# Patient Record
Sex: Female | Born: 1960 | Race: White | Hispanic: No | Marital: Married | Smoking: Never smoker
Health system: Southern US, Community
[De-identification: ages and names within clinical notes are randomized; demographics above are authoritative.]

## PROBLEM LIST (undated history)

## (undated) DIAGNOSIS — I493 Ventricular premature depolarization: Secondary | ICD-10-CM

## (undated) DIAGNOSIS — E079 Disorder of thyroid, unspecified: Secondary | ICD-10-CM

## (undated) DIAGNOSIS — K589 Irritable bowel syndrome without diarrhea: Secondary | ICD-10-CM

## (undated) DIAGNOSIS — G8929 Other chronic pain: Secondary | ICD-10-CM

## (undated) DIAGNOSIS — B009 Herpesviral infection, unspecified: Secondary | ICD-10-CM

## (undated) DIAGNOSIS — N763 Subacute and chronic vulvitis: Secondary | ICD-10-CM

## (undated) DIAGNOSIS — M549 Dorsalgia, unspecified: Secondary | ICD-10-CM

## (undated) DIAGNOSIS — B379 Candidiasis, unspecified: Secondary | ICD-10-CM

## (undated) DIAGNOSIS — R87619 Unspecified abnormal cytological findings in specimens from cervix uteri: Secondary | ICD-10-CM

## (undated) HISTORY — DX: Ventricular premature depolarization: I49.3

## (undated) HISTORY — DX: Herpesviral infection, unspecified: B00.9

## (undated) HISTORY — DX: Irritable bowel syndrome, unspecified: K58.9

## (undated) HISTORY — PX: OTHER SURGICAL HISTORY: SHX169

## (undated) HISTORY — DX: Disorder of thyroid, unspecified: E07.9

## (undated) HISTORY — DX: Subacute and chronic vulvitis: N76.3

## (undated) HISTORY — DX: Other chronic pain: G89.29

## (undated) HISTORY — DX: Candidiasis, unspecified: B37.9

## (undated) HISTORY — DX: Hypocalcemia: E83.51

## (undated) HISTORY — DX: Dorsalgia, unspecified: M54.9

## (undated) HISTORY — PX: COLPOSCOPY: SHX161

## (undated) HISTORY — DX: Unspecified abnormal cytological findings in specimens from cervix uteri: R87.619

---

## 1986-01-24 HISTORY — PX: DIAGNOSTIC LAPAROSCOPY: SUR761

## 1987-01-25 HISTORY — PX: OTHER SURGICAL HISTORY: SHX169

## 1995-10-25 HISTORY — PX: THYROIDECTOMY: SHX17

## 1997-06-03 ENCOUNTER — Ambulatory Visit (HOSPITAL_COMMUNITY): Admission: RE | Admit: 1997-06-03 | Discharge: 1997-06-03 | Payer: Self-pay

## 1999-06-16 ENCOUNTER — Other Ambulatory Visit: Admission: RE | Admit: 1999-06-16 | Discharge: 1999-06-16 | Payer: Self-pay | Admitting: *Deleted

## 2000-06-14 ENCOUNTER — Other Ambulatory Visit: Admission: RE | Admit: 2000-06-14 | Discharge: 2000-06-14 | Payer: Self-pay | Admitting: *Deleted

## 2001-05-30 ENCOUNTER — Other Ambulatory Visit: Admission: RE | Admit: 2001-05-30 | Discharge: 2001-05-30 | Payer: Self-pay | Admitting: *Deleted

## 2002-01-24 DIAGNOSIS — I493 Ventricular premature depolarization: Secondary | ICD-10-CM

## 2002-01-24 HISTORY — DX: Ventricular premature depolarization: I49.3

## 2002-06-14 ENCOUNTER — Other Ambulatory Visit: Admission: RE | Admit: 2002-06-14 | Discharge: 2002-06-14 | Payer: Self-pay | Admitting: *Deleted

## 2003-06-18 ENCOUNTER — Other Ambulatory Visit: Admission: RE | Admit: 2003-06-18 | Discharge: 2003-06-18 | Payer: Self-pay | Admitting: *Deleted

## 2004-06-23 ENCOUNTER — Other Ambulatory Visit: Admission: RE | Admit: 2004-06-23 | Discharge: 2004-06-23 | Payer: Self-pay | Admitting: *Deleted

## 2005-06-29 ENCOUNTER — Other Ambulatory Visit: Admission: RE | Admit: 2005-06-29 | Discharge: 2005-06-29 | Payer: Self-pay | Admitting: Obstetrics and Gynecology

## 2006-09-22 ENCOUNTER — Other Ambulatory Visit: Admission: RE | Admit: 2006-09-22 | Discharge: 2006-09-22 | Payer: Self-pay | Admitting: Obstetrics and Gynecology

## 2007-07-25 HISTORY — PX: BREAST ENHANCEMENT SURGERY: SHX7

## 2007-09-28 ENCOUNTER — Other Ambulatory Visit: Admission: RE | Admit: 2007-09-28 | Discharge: 2007-09-28 | Payer: Self-pay | Admitting: Obstetrics and Gynecology

## 2007-11-15 ENCOUNTER — Encounter: Admission: RE | Admit: 2007-11-15 | Discharge: 2007-11-15 | Payer: Self-pay | Admitting: Orthopedic Surgery

## 2007-12-14 ENCOUNTER — Encounter: Admission: RE | Admit: 2007-12-14 | Discharge: 2007-12-14 | Payer: Self-pay | Admitting: Orthopedic Surgery

## 2011-05-27 ENCOUNTER — Encounter (INDEPENDENT_AMBULATORY_CARE_PROVIDER_SITE_OTHER): Payer: Self-pay

## 2012-05-28 ENCOUNTER — Telehealth: Payer: Self-pay | Admitting: *Deleted

## 2012-05-28 NOTE — Telephone Encounter (Signed)
[facility] order faxed back for Bone Density scheduled for 06/01/2012 @ 10:30am.

## 2012-05-29 ENCOUNTER — Other Ambulatory Visit: Payer: Self-pay | Admitting: Nurse Practitioner

## 2012-06-19 ENCOUNTER — Telehealth: Payer: Self-pay | Admitting: Obstetrics and Gynecology

## 2012-12-06 ENCOUNTER — Encounter: Payer: Self-pay | Admitting: Obstetrics and Gynecology

## 2012-12-06 DIAGNOSIS — K589 Irritable bowel syndrome without diarrhea: Secondary | ICD-10-CM | POA: Insufficient documentation

## 2012-12-07 ENCOUNTER — Encounter: Payer: Self-pay | Admitting: Gynecology

## 2012-12-07 ENCOUNTER — Ambulatory Visit (INDEPENDENT_AMBULATORY_CARE_PROVIDER_SITE_OTHER): Payer: Managed Care, Other (non HMO) | Admitting: Gynecology

## 2012-12-07 ENCOUNTER — Ambulatory Visit: Payer: Self-pay | Admitting: Obstetrics and Gynecology

## 2012-12-07 VITALS — BP 122/76 | HR 78 | Resp 16 | Ht 64.5 in | Wt 137.0 lb

## 2012-12-07 DIAGNOSIS — N938 Other specified abnormal uterine and vaginal bleeding: Secondary | ICD-10-CM

## 2012-12-07 DIAGNOSIS — B3731 Acute candidiasis of vulva and vagina: Secondary | ICD-10-CM

## 2012-12-07 DIAGNOSIS — Z01419 Encounter for gynecological examination (general) (routine) without abnormal findings: Secondary | ICD-10-CM

## 2012-12-07 DIAGNOSIS — B373 Candidiasis of vulva and vagina: Secondary | ICD-10-CM

## 2012-12-07 DIAGNOSIS — Z309 Encounter for contraceptive management, unspecified: Secondary | ICD-10-CM

## 2012-12-07 DIAGNOSIS — B009 Herpesviral infection, unspecified: Secondary | ICD-10-CM

## 2012-12-07 DIAGNOSIS — Z124 Encounter for screening for malignant neoplasm of cervix: Secondary | ICD-10-CM

## 2012-12-07 DIAGNOSIS — N949 Unspecified condition associated with female genital organs and menstrual cycle: Secondary | ICD-10-CM

## 2012-12-07 MED ORDER — DROSPIRENONE-ETHINYL ESTRADIOL 3-0.02 MG PO TABS: 1.0000 | ORAL_TABLET | Freq: Every day | ORAL | Status: DC

## 2012-12-07 MED ORDER — ACYCLOVIR 400 MG PO TABS: 400.0000 mg | ORAL_TABLET | Freq: Two times a day (BID) | ORAL | Status: DC

## 2012-12-07 MED ORDER — FLUCONAZOLE 150 MG PO TABS: 150.0000 mg | ORAL_TABLET | Freq: Every day | ORAL | Status: DC

## 2012-12-07 NOTE — Progress Notes (Signed)
52 y.o. Married Caucasian female   G2P2002 here for annual exam. Pt reports menses are regular.  She occassional report hot flashes, does not have night sweats, does not have vaginal dryness.  She is not using lubricants. She does not report post-menopasual bleeding.  Pt with history of chronic yeast and had many medications that she self medicates with-diflucan for approx 2 yeast infections per month, in addition uses desowen prn after sex, and cleocin rarely.  Pt also had a history of vaginal group B strep that symptomatic.  Pt has been spotting on current ocp, is on continuous ocp, last withdraw bleed in 4/14 but was not spotting.  Pt has had intermittent spotting on continuous ocp for "some time" spotting is red/brown and for a day.  No post-coital bleeding.  History of cervical polyp.  Patient's last menstrual period was 04/24/2012.          Sexually active: yes  The current method of family planning is OCP (estrogen/progesterone).    Exercising: yes  Walking 2-3 days a week Last pap:11/26/10 neg Abnormal PAP: no Mammogram:04/2012 normal BSE:  yes Colonoscopy: 08/2011 could not finish exam(in pain) DEXA:04/2012 normal Alcohol: occ glass of wine Tobacco: never Hgb PCP    U/A  PCP Health Maintenance  Topic Date Due  . Influenza Vaccine  08/24/2012  . Pap Smear  12/05/2013  . Mammogram  05/26/2014  . Tetanus/tdap  01/25/2016  . Colonoscopy  09/08/2021    Family History  Problem Relation Age of Onset  . Hypertension Mother   . Stroke Mother   . Thyroid disease Father     Hyper  . Osteoporosis Father   . Diabetes Brother     Patient Active Problem List   Diagnosis Date Noted  . IBS (irritable bowel syndrome)     Past Medical History  Diagnosis Date  . IBS (irritable bowel syndrome)   . Chronic vulvitis     bx benign hyperplasia  . Hypocalcemia   . HSV-1 infection   . PVC (premature ventricular contraction) 2004  . Yeast infection     Chronic  . Thyroid disease      Hypothyroidism, Hypoparathyroidism  . Chronic back pain     Past Surgical History  Procedure Laterality Date  . Thyroidectomy  10/1995    due to thyroid nodule  . Diagnostic laparoscopy  1988  . Breast enhancement surgery  07/2007    Saline  . Hnp re-repair  1/10  2009  . Thumb surgery Left 1989    Allergies: Asa; Codeine; Macrobid; Nsaids; and Sulfa antibiotics  Current Outpatient Prescriptions  Medication Sig Dispense Refill  . acyclovir (ZOVIRAX) 400 MG tablet Take 400 mg by mouth 5 (five) times daily.      . calcitRIOL (ROCALTROL) 0.25 MCG capsule daily.       Marland Kitchen CALCIUM PO Take 600 mg by mouth 2 (two) times daily.       . clindamycin (CLEOCIN) 2 % vaginal cream USE AT BEDTIME FOR 5 DAYS AS NEEDED.  40 g  1  . Desonide (DESOWEN EX) Apply topically as needed.      . diazepam (VALIUM) 5 MG tablet Take 5 mg by mouth as needed for anxiety.      . drospirenone-ethinyl estradiol (OCELLA) 3-0.03 MG tablet Take 1 tablet by mouth daily. Patient takes continuous      . hydrochlorothiazide (HYDRODIURIL) 25 MG tablet       . levothyroxine (SYNTHROID) 137 MCG tablet Take 137 mcg by mouth  daily before breakfast.      . Liothyronine Sodium (CYTOMEL PO) Take by mouth.      . Loratadine (CLARITIN PO) Take by mouth.      . Magnesium 500 MG CAPS Take by mouth.      . Multiple Vitamins-Minerals (MULTIVITAMIN PO) Take by mouth.      . Omega-3 Fatty Acids (FISH OIL PO) Take by mouth.      . pregabalin (LYRICA) 50 MG capsule Take 50 mg by mouth 3 (three) times daily.      Marland Kitchen amoxicillin (AMOXIL) 875 MG tablet as needed.       Marland Kitchen BORIC ACID EX Apply topically as needed.      . Cholecalciferol (VITAMIN D PO) Take by mouth.      . fluconazole (DIFLUCAN) 150 MG tablet Take 150 mg by mouth daily.       No current facility-administered medications for this visit.    ROS: Pertinent items are noted in HPI.  Exam:    BP 122/76  Pulse 78  Resp 16  Ht 5' 4.5" (1.638 m)  Wt 137 lb (62.143 kg)  BMI  23.16 kg/m2  LMP 04/24/2012 Weight change: @WEIGHTCHANGE @ Last 3 height recordings:  Ht Readings from Last 3 Encounters:  12/07/12 5' 4.5" (1.638 m)   General appearance: alert, cooperative and appears stated age Head: Normocephalic, without obvious abnormality, atraumatic Neck: no adenopathy, no carotid bruit, no JVD, supple, symmetrical, trachea midline and thyroid not enlarged, symmetric, no tenderness/mass/nodules Lungs: clear to auscultation bilaterally Breasts: normal appearance, no masses or tenderness Heart: regular rate and rhythm, S1, S2 normal, no murmur, click, rub or gallop Abdomen: soft, non-tender; bowel sounds normal; no masses,  no organomegaly Extremities: extremities normal, atraumatic, no cyanosis or edema Skin: Skin color, texture, turgor normal. No rashes or lesions Lymph nodes: Cervical, supraclavicular, and axillary nodes normal. no inguinal nodes palpated Neurologic: Grossly normal   Pelvic: External genitalia:  no lesions              Urethra: normal appearing urethra with no masses, tenderness or lesions              Bartholins and Skenes: normal                 Vagina: normal appearing vagina with normal color and discharge, no lesions              Cervix: nabothian cysts              Pap taken: yes        Bimanual Exam:  Uterus:  uterus is normal size, shape, consistency and nontender                                      Adnexa:    normal adnexa in size, nontender and no masses                                      Rectovaginal: Confirms                                      Anus:  normal sphincter tone, no lesions  A: well woman no contraindication to continue use of oral contraceptives Break though  bleeding on ocp Chronic vaginitis   P: mammogram annual pap smear with HRHPV Will change ocp to lower estrogen-yaz sent in, will have withdraw bleed before changing dose, SHG and possible EMB  Will try weekly suppression with diflucan counseled on breast  self exam, mammography screening, feminine hygiene, adequate intake of calcium and vitamin D, diet and exercise return annually or prn Discussed PAP guideline changes, importance of weight bearing exercises, calcium, vit D and balanced diet.  An After Visit Summary was printed and given to the patient.

## 2012-12-07 NOTE — Patient Instructions (Signed)

## 2012-12-10 ENCOUNTER — Telehealth: Payer: Self-pay | Admitting: Certified Nurse Midwife

## 2012-12-10 NOTE — Telephone Encounter (Signed)
[facility] calling for verification on the acyclovir 400mg . Reference number is [id number] And their number is [phone].

## 2012-12-11 LAB — IPS PAP TEST WITH HPV

## 2012-12-12 NOTE — Telephone Encounter (Signed)
I spoke to [place] at [facility]  [place] and quantity verified.  They will process RX and send out to patient.  They are unsure why we were called.

## 2012-12-25 ENCOUNTER — Ambulatory Visit (INDEPENDENT_AMBULATORY_CARE_PROVIDER_SITE_OTHER): Payer: Managed Care, Other (non HMO)

## 2012-12-25 ENCOUNTER — Ambulatory Visit (INDEPENDENT_AMBULATORY_CARE_PROVIDER_SITE_OTHER): Payer: Managed Care, Other (non HMO) | Admitting: Gynecology

## 2012-12-25 ENCOUNTER — Other Ambulatory Visit: Payer: Self-pay | Admitting: Gynecology

## 2012-12-25 VITALS — BP 124/74 | HR 62 | Resp 18 | Ht 64.5 in | Wt 138.0 lb

## 2012-12-25 DIAGNOSIS — N938 Other specified abnormal uterine and vaginal bleeding: Secondary | ICD-10-CM

## 2012-12-25 DIAGNOSIS — N949 Unspecified condition associated with female genital organs and menstrual cycle: Secondary | ICD-10-CM

## 2012-12-25 NOTE — Progress Notes (Signed)
      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  Pt had been asked to stop ocp to allow for withdraw bleed and reports bled for 5d, changing pads 4x/d with mild cramping, she restarted her yazmin as she had a pack and is currently not bleeding on ocp.  Pt is without complaints.  She intends to change to yaz after this pack complete. U/s images reviewed with pt, the lining appears thin and homogeneous.  Uterus and adnexa are unremarkable. Discussed findings with pt, although scheduled for SHG and poss emb, suggest watching bleeding pattern based on u/s findings.  Pt agreeable with change in plan.  Suggest f/u visit in 58m to assess bleeding abnormalities and if continues would consider only emb and not SHG.. Pt agreeable. Questions addressed 65m spend discussing treatment options, >505 face to face.

## 2013-05-03 ENCOUNTER — Ambulatory Visit: Payer: Managed Care, Other (non HMO) | Admitting: Gynecology

## 2013-05-24 HISTORY — PX: BASAL CELL CARCINOMA EXCISION: SHX1214

## 2013-11-21 ENCOUNTER — Telehealth: Payer: Self-pay | Admitting: Gynecology

## 2013-11-21 NOTE — Telephone Encounter (Signed)
Call to pt to let her know that her appt has been canceled with dr lathrop on 12/13/13 and we need to rs. lmtcb

## 2013-11-25 ENCOUNTER — Encounter: Payer: Self-pay | Admitting: Gynecology

## 2013-12-13 ENCOUNTER — Ambulatory Visit: Payer: Managed Care, Other (non HMO) | Admitting: Gynecology

## 2013-12-23 ENCOUNTER — Other Ambulatory Visit: Payer: Self-pay | Admitting: *Deleted

## 2013-12-23 MED ORDER — ACYCLOVIR 400 MG PO TABS: 400.0000 mg | ORAL_TABLET | Freq: Two times a day (BID) | ORAL | Status: DC

## 2013-12-23 NOTE — Telephone Encounter (Signed)
Fax From: [facility] for Acyclovir 400 mg Last Refilled: 12/07/12 #180/3 rfs by Dr. Farrel GobbleLathrop Aex Scheduled:  01/10/14 with Ms. Patty  Please advise.

## 2013-12-25 NOTE — Telephone Encounter (Signed)
Fax faxed back to [facility] stating refills have been sent.

## 2014-01-10 ENCOUNTER — Ambulatory Visit (INDEPENDENT_AMBULATORY_CARE_PROVIDER_SITE_OTHER): Payer: Managed Care, Other (non HMO) | Admitting: Nurse Practitioner

## 2014-01-10 ENCOUNTER — Encounter: Payer: Self-pay | Admitting: Nurse Practitioner

## 2014-01-10 VITALS — BP 144/76 | HR 100 | Resp 18 | Ht 64.75 in | Wt 132.6 lb

## 2014-01-10 DIAGNOSIS — N763 Subacute and chronic vulvitis: Secondary | ICD-10-CM

## 2014-01-10 DIAGNOSIS — Z01419 Encounter for gynecological examination (general) (routine) without abnormal findings: Secondary | ICD-10-CM

## 2014-01-10 MED ORDER — AMOXICILLIN 875 MG PO TABS: 875.0000 mg | ORAL_TABLET | ORAL | Status: DC | PRN

## 2014-01-10 MED ORDER — FLUCONAZOLE 150 MG PO TABS: ORAL_TABLET | ORAL | Status: DC

## 2014-01-10 MED ORDER — DESONIDE 0.05 % EX OINT
TOPICAL_OINTMENT | CUTANEOUS | Status: DC | PRN
Start: 2014-01-10 — End: 2019-04-18

## 2014-01-10 MED ORDER — ACYCLOVIR 400 MG PO TABS: 400.0000 mg | ORAL_TABLET | Freq: Two times a day (BID) | ORAL | Status: DC

## 2014-01-10 MED ORDER — DROSPIRENONE-ETHINYL ESTRADIOL 3-0.02 MG PO TABS: 1.0000 | ORAL_TABLET | Freq: Every day | ORAL | Status: DC

## 2014-01-10 MED ORDER — CLINDAMYCIN PHOSPHATE 2 % VA CREA: TOPICAL_CREAM | VAGINAL | Status: DC

## 2014-01-10 NOTE — Patient Instructions (Signed)

## 2014-01-10 NOTE — Progress Notes (Signed)
53 y.o. 682P2002 Married Caucasian Fe here for annual exam.  No further vaginal bleeding.  Occasional vaso symptoms.  On continuous OCP.  Chronic history of vulvitis and is really concerned about coming off OCP.  Patient's last menstrual period was 05/24/2013.          Sexually active: Yes.    The current method of family planning is OCP (estrogen/progesterone).    Exercising: No.  The patient does not participate in regular exercise at present. Smoker:  no  Health Maintenance: Pap:  12/07/12 NEG HR HPV MMG:  06/14/13 Bi-Rads 1: Negative Colonoscopy:  09/09/11- Normal f/u in 2023 BMD:   06/01/12 TDaP:  2008 Labs: Endocrinologist ; Urine: endo   reports that she has never smoked. She has never used smokeless tobacco. She reports that she drinks about 0.5 - 1.0 oz of alcohol per week. She reports that she does not use illicit drugs.  Past Medical History  Diagnosis Date  . IBS (irritable bowel syndrome)   . Chronic vulvitis     bx benign hyperplasia  . Hypocalcemia   . HSV-1 infection   . PVC (premature ventricular contraction) 2004  . Yeast infection     Chronic  . Thyroid disease     Hypothyroidism, Hypoparathyroidism  . Chronic back pain     Past Surgical History  Procedure Laterality Date  . Thyroidectomy  10/1995    due to thyroid nodule  . Diagnostic laparoscopy  1988  . Breast enhancement surgery  07/2007    Saline  . Hnp re-repair  1/10  2009  . Thumb surgery Left 1989  . Basal cell carcinoma excision Left 05/2013    Current Outpatient Prescriptions  Medication Sig Dispense Refill  . acyclovir (ZOVIRAX) 400 MG tablet Take 1 tablet (400 mg total) by mouth 2 (two) times daily. 180 tablet 3  . amoxicillin (AMOXIL) 875 MG tablet Take 1 tablet (875 mg total) by mouth as needed. 20 tablet 1  . BORIC ACID EX Apply topically as needed.    . calcitRIOL (ROCALTROL) 0.25 MCG capsule daily.     Marland Kitchen. CALCIUM PO Take 600 mg by mouth 2 (two) times daily.     . Cholecalciferol  (VITAMIN D PO) Take by mouth.    . clindamycin (CLEOCIN) 2 % vaginal cream USE AT BEDTIME FOR 5 DAYS AS NEEDED. 40 g 1  . cyclobenzaprine (FLEXERIL) 5 MG tablet Take 5 mg by mouth as needed.    . desonide (DESOWEN) 0.05 % ointment Apply topically as needed. 60 g 1  . diazepam (VALIUM) 5 MG tablet Take 5 mg by mouth as needed for muscle spasms.     . drospirenone-ethinyl estradiol (YAZ,GIANVI,LORYNA) 3-0.02 MG tablet Take 1 tablet by mouth daily. No placebo 4 Package 3  . hydrochlorothiazide (HYDRODIURIL) 25 MG tablet     . levothyroxine (SYNTHROID) 137 MCG tablet Take 137 mcg by mouth daily before breakfast.    . Liothyronine Sodium (CYTOMEL PO) Take by mouth.    . Magnesium 500 MG CAPS Take by mouth.    . Multiple Vitamins-Minerals (MULTIVITAMIN PO) Take by mouth.    . Omega-3 Fatty Acids (FISH OIL PO) Take by mouth.    . pregabalin (LYRICA) 50 MG capsule Take 50 mg by mouth 3 (three) times daily.    . fluconazole (DIFLUCAN) 150 MG tablet 1 tablet weekly X 4 4 tablet 1   No current facility-administered medications for this visit.    Family History  Problem Relation Age of  Onset  . Hypertension Mother   . Stroke Mother   . Thyroid disease Father     Hyper  . Osteoporosis Father   . Diabetes Brother     ROS:  Pertinent items are noted in HPI.  Otherwise, a comprehensive ROS was negative.  Exam:   BP 144/76 mmHg  Pulse 100  Resp 18  Ht 5' 4.75" (1.645 m)  Wt 132 lb 9.6 oz (60.147 kg)  BMI 22.23 kg/m2  LMP 05/24/2013 Height: 5' 4.75" (164.5 cm)  Ht Readings from Last 3 Encounters:  01/10/14 5' 4.75" (1.645 m)  12/25/12 5' 4.5" (1.638 m)  12/07/12 5' 4.5" (1.638 m)    General appearance: alert, cooperative and appears stated age Head: Normocephalic, without obvious abnormality, atraumatic Neck: no adenopathy, supple, symmetrical, trachea midline and thyroid normal to inspection and palpation Lungs: clear to auscultation bilaterally Breasts: normal appearance, no masses or  tenderness Heart: regular rate and rhythm Abdomen: soft, non-tender; no masses,  no organomegaly Extremities: extremities normal, atraumatic, no cyanosis or edema Skin: Skin color, texture, turgor normal. No rashes or lesions Lymph nodes: Cervical, supraclavicular, and axillary nodes normal. No abnormal inguinal nodes palpated Neurologic: Grossly normal   Pelvic: External genitalia:  no lesions              Urethra:  normal appearing urethra with no masses, tenderness or lesions              Bartholin's and Skene's: normal                 Vagina: normal appearing vagina with normal color and discharge, no lesions              Cervix: anteverted              Pap taken: No. Bimanual Exam:  Uterus:  normal size, contour, position, consistency, mobility, non-tender              Adnexa: no mass, fullness, tenderness               Rectovaginal: Confirms               Anus:  normal sphincter tone, no lesions  A:  Well Woman with normal exam  No contraindication to continue use of oral contraceptives  Chronic vaginitis / vulvitis  P:   Reviewed health and wellness pertinent to exam  Pap smear not taken today  Mammogram is due 05/2014  Refill on med's for vulvitis  Counseled on breast self exam, mammography screening, use and side effects of OCP's, adequate intake of calcium and vitamin D, diet and exercise return annually or prn  An After Visit Summary was printed and given to the patient.

## 2014-01-12 NOTE — Progress Notes (Signed)
Encounter reviewed by Dr. Elona Yinger Silva.  

## 2014-03-22 ENCOUNTER — Other Ambulatory Visit: Payer: Self-pay | Admitting: Nurse Practitioner

## 2014-03-24 ENCOUNTER — Other Ambulatory Visit: Payer: Self-pay | Admitting: *Deleted

## 2014-03-24 NOTE — Telephone Encounter (Signed)
Medication refill request: Acyclovir 400 mg  Last AEX:  01/10/14 with PG  Next AEX: 01/30/15 with PG Last MMG (if hormonal medication request): n/a Refill authorized: #180/3 rfs, please advise.

## 2014-04-15 ENCOUNTER — Other Ambulatory Visit: Payer: Self-pay | Admitting: Nurse Practitioner

## 2014-08-07 ENCOUNTER — Other Ambulatory Visit: Payer: Self-pay | Admitting: *Deleted

## 2014-08-07 MED ORDER — FLUCONAZOLE 150 MG PO TABS: ORAL_TABLET | ORAL | Status: DC

## 2014-08-07 NOTE — Telephone Encounter (Addendum)
Faxed Medication refill request  From [facility]: Dilfucan 150 mg  Last AEX:  01/10/14 with PG #4/1 rfs sent to [facility] Next AEX: 01/30/15 with PG Last MMG (if hormonal medication request): n/a Refill authorized: Please advise  (routed to DL since PG is out of office)

## 2014-10-13 ENCOUNTER — Other Ambulatory Visit: Payer: Self-pay

## 2014-10-13 MED ORDER — CLINDAMYCIN PHOSPHATE 2 % VA CREA: TOPICAL_CREAM | VAGINAL | Status: DC

## 2014-10-13 NOTE — Telephone Encounter (Signed)
Medication refill request: Clindamycin vaginal cream Last AEX:  12/31/13 with PG Next AEX: 01/30/15 with PG Last MMG (if hormonal medication request):  Refill authorized: please advise

## 2014-11-17 ENCOUNTER — Other Ambulatory Visit: Payer: Self-pay | Admitting: *Deleted

## 2014-11-17 MED ORDER — DROSPIRENONE-ETHINYL ESTRADIOL 3-0.02 MG PO TABS: 1.0000 | ORAL_TABLET | Freq: Every day | ORAL | Status: DC

## 2014-11-17 NOTE — Telephone Encounter (Signed)
Medication refill request: yaz  Last AEX:  01/10/14 PG Next AEX: 01/30/15 PG Last MMG (if hormonal medication request): 07/25/14 BIRADS2:Benign  Refill authorized: 01/10/14 #4packs/3R. Today #4pack/0R?

## 2014-11-25 ENCOUNTER — Other Ambulatory Visit: Payer: Self-pay | Admitting: *Deleted

## 2014-11-25 MED ORDER — CLINDAMYCIN PHOSPHATE 2 % VA CREA: TOPICAL_CREAM | VAGINAL | Status: DC

## 2014-11-25 NOTE — Telephone Encounter (Signed)
Medication refill request: Clindamycin  Last AEX:  01-10-14  Next AEX: 01-30-15 Last MMG (if hormonal medication request): 07-25-14 WNL  Refill authorized: please advise

## 2015-01-30 ENCOUNTER — Ambulatory Visit (INDEPENDENT_AMBULATORY_CARE_PROVIDER_SITE_OTHER): Payer: Managed Care, Other (non HMO) | Admitting: Nurse Practitioner

## 2015-01-30 ENCOUNTER — Encounter: Payer: Self-pay | Admitting: Nurse Practitioner

## 2015-01-30 VITALS — BP 116/74 | HR 92 | Ht 64.75 in | Wt 131.0 lb

## 2015-01-30 DIAGNOSIS — Z0000 Encounter for general adult medical examination without abnormal findings: Secondary | ICD-10-CM | POA: Diagnosis not present

## 2015-01-30 DIAGNOSIS — Z01419 Encounter for gynecological examination (general) (routine) without abnormal findings: Secondary | ICD-10-CM

## 2015-01-30 DIAGNOSIS — N951 Menopausal and female climacteric states: Secondary | ICD-10-CM | POA: Diagnosis not present

## 2015-01-30 MED ORDER — ESTRADIOL 10 MCG VA TABS: ORAL_TABLET | VAGINAL | Status: DC

## 2015-01-30 MED ORDER — CLINDAMYCIN PHOSPHATE 2 % VA CREA: TOPICAL_CREAM | VAGINAL | Status: DC

## 2015-01-30 MED ORDER — FLUCONAZOLE 150 MG PO TABS: ORAL_TABLET | ORAL | Status: DC

## 2015-01-30 MED ORDER — MEDROXYPROGESTERONE ACETATE 5 MG PO TABS: 5.0000 mg | ORAL_TABLET | Freq: Every day | ORAL | Status: DC

## 2015-01-30 MED ORDER — ACYCLOVIR 400 MG PO TABS: 400.0000 mg | ORAL_TABLET | Freq: Two times a day (BID) | ORAL | Status: DC

## 2015-01-30 MED ORDER — ESTRADIOL 0.5 MG PO TABS: 0.5000 mg | ORAL_TABLET | Freq: Every day | ORAL | Status: DC

## 2015-01-30 NOTE — Patient Instructions (Signed)

## 2015-01-30 NOTE — Progress Notes (Signed)
Patient ID: [name], female   DOB: 02/12/1960, 55 y.o.   MRN: [id number]  55 y.o. 332P2002 Married  Caucasian Fe here for annual exam.  She decided to stop OCP in November and did have a light spotting at withdrawal. (When she was on continuous OCP about every 4-6 months would come to have a cycle which was light and spotting.)   She wanted to see if her hormones were going to be OK before she came in for her visit.  Since then she has significant vaso symptoms with an increase in insomnia.  Back in 02/2014 she had some thyroid med's changed and felt bad in addition to palpitations.  She then had cardio evaluation which was negative.  Found PVC that was rare but related to her thyroid.  She was placed on ASA daily.  Chronic vaginitis symptoms are doing OK for now but having an increase in vaginal dryness. She continues working at [employer].  Patient's last menstrual period was 12/22/2014 (exact date).          Sexually active: Yes.    The current method of family planning is none.    Exercising: No.  The patient does not participate in regular exercise at present. Smoker:  no  Health Maintenance: Pap: 12/07/12, Negative with neg HR HPV MMG:07/25/14, 3D, Bi-Rads 2: benign Colonoscopy: 09/09/11- Normal f/u in 2023 BMD: 06/01/12, T Score 1.4 Spine / 0.7 Right Femur Neck / 1.1 Left Femur Neck TDaP: 2008 Shingles: Not indicated due to age Pneumonia: Not indicated due to age Hep C and HIV: will do today Labs: Endocrinology takes care of all labs   reports that she has never smoked. She has never used smokeless tobacco. She reports that she drinks about 0.5 - 1.0 oz of alcohol per week. She reports that she does not use illicit drugs.  Past Medical History  Diagnosis Date  . IBS (irritable bowel syndrome)   . Chronic vulvitis     bx benign hyperplasia  . Hypocalcemia   . HSV-1 infection   . PVC (premature ventricular contraction) 2004  . Yeast infection     Chronic   . Chronic back pain   . Thyroid disease     Hypothyroidism, Hypoparathyroidism    Past Surgical History  Procedure Laterality Date  . Thyroidectomy  10/1995    due to thyroid nodule  . Diagnostic laparoscopy  1988  . Breast enhancement surgery  07/2007    Saline  . Hnp re-repair  1/10  2009  . Thumb surgery Left 1989  . Basal cell carcinoma excision Left 05/2013    Current Outpatient Prescriptions  Medication Sig Dispense Refill  . acyclovir (ZOVIRAX) 400 MG tablet Take 1 tablet (400 mg total) by mouth 2 (two) times daily. 180 tablet 3  . amoxicillin (AMOXIL) 875 MG tablet Take 1 tablet (875 mg total) by mouth as needed. 20 tablet 1  . BORIC ACID EX Apply topically as needed.    . calcitRIOL (ROCALTROL) 0.25 MCG capsule daily.     Marland Kitchen. CALCIUM PO Take 600 mg by mouth 2 (two) times daily.     . Cholecalciferol (VITAMIN D PO) Take by mouth.    . clindamycin (CLEOCIN) 2 % vaginal cream USE AT BEDTIME FOR 5 DAYS AS NEEDED. 40 g 4  . cyclobenzaprine (FLEXERIL) 5 MG tablet Take 5 mg by mouth as needed.    . desonide (DESOWEN) 0.05 % ointment Apply topically as needed. 60 g 1  .  diazepam (VALIUM) 5 MG tablet Take 5 mg by mouth as needed for muscle spasms.     . fluconazole (DIFLUCAN) 150 MG tablet 1 tablet weekly X 4 4 tablet 4  . hydrochlorothiazide (HYDRODIURIL) 25 MG tablet     . liothyronine (CYTOMEL) 5 MCG tablet Take 1 tablet by mouth daily.    . Magnesium 500 MG CAPS Take by mouth.    . Multiple Vitamins-Minerals (MULTIVITAMIN PO) Take by mouth.    . Omega-3 Fatty Acids (FISH OIL PO) Take by mouth.    . pregabalin (LYRICA) 50 MG capsule Take 50 mg by mouth 3 (three) times daily.    Marland Kitchen SYNTHROID 125 MCG tablet Take 1 tablet by mouth daily.    Marland Kitchen estradiol (ESTRACE) 0.5 MG tablet Take 1 tablet (0.5 mg total) by mouth daily. 90 tablet 4  . Estradiol 10 MCG TABS vaginal tablet Use nightly X 14 then twice a week 18 tablet 11  . medroxyPROGESTERone (PROVERA) 5 MG tablet Take 1 tablet (5  mg total) by mouth daily. 90 tablet 4   No current facility-administered medications for this visit.    Family History  Problem Relation Age of Onset  . Hypertension Mother   . Stroke Mother   . Thyroid disease Father     Hyper  . Osteoporosis Father   . Diabetes Brother     ROS:  Pertinent items are noted in HPI.  Otherwise, a comprehensive ROS was negative.  Exam:   BP 116/74 mmHg  Pulse 92  Ht 5' 4.75" (1.645 m)  Wt 131 lb (59.421 kg)  BMI 21.96 kg/m2  LMP 12/22/2014 (Exact Date) Height: 5' 4.75" (164.5 cm) Ht Readings from Last 3 Encounters:  01/30/15 5' 4.75" (1.645 m)  01/10/14 5' 4.75" (1.645 m)  12/25/12 5' 4.5" (1.638 m)    General appearance: alert, cooperative and appears stated age Head: Normocephalic, without obvious abnormality, atraumatic Neck: no adenopathy, supple, symmetrical, trachea midline and thyroid normal to inspection and palpation Lungs: clear to auscultation bilaterally Breasts: normal appearance, no masses or tenderness, positive findings: implants bilaterally Heart: regular rate and rhythm Abdomen: soft, non-tender; no masses,  no organomegaly Extremities: extremities normal, atraumatic, no cyanosis or edema Skin: Skin color, texture, turgor normal. No rashes or lesions Lymph nodes: Cervical, supraclavicular, and axillary nodes normal. No abnormal inguinal nodes palpated Neurologic: Grossly normal   Pelvic: External genitalia:  no lesions              Urethra:  normal appearing urethra with no masses, tenderness or lesions              Bartholin's and Skene's: normal                 Vagina: normal appearing vagina with normal color and discharge, no lesions              Cervix: anteverted              Pap taken: No. Bimanual Exam:  Uterus:  normal size, contour, position, consistency, mobility, non-tender              Adnexa: no mass, fullness, tenderness               Rectovaginal: Confirms               Anus:  normal sphincter tone,  no lesions  Chaperone present: yes  A:  Well Woman with normal exam  Now off oral contraceptives X 2 months  with significant vaso symptoms  Initiate HRT and follow Chronic vaginitis / vulvitis, now vaginal dryness  History of Hypocalcemia with hypoparathyroid  Hypothyroid on replacement    P:   Reviewed health and wellness pertinent to exam  Pap smear as above  Mammogram is due 07/2015  Will start her on Estrace 0.5 mg tablet daily along with Provera 5 mg daily, will call back with a response if still symptomatic  For the vaginal dryness will start on Vagifem twice weekly  Counseled with risk of DVT, CVA, cancer, etc  Will follow with labs  Does not need a refill on Desowen at this time, she did need a refill on Cleocin 2 % vaginal cream which she uses rarely  Refill on Zovirax 400 mg prn  Counseled on breast self exam, mammography screening, adequate intake of calcium and vitamin D, diet and exercise, Kegel's exercises return annually or prn  An After Visit Summary was printed and given to the patient.   Addendum:  Per recommendation:  The dose of Provera was changed from 5 mg to 2.5 mg daily.  She is left a message per DPR on her phone.  If any questions to call back.

## 2015-01-31 LAB — HIV ANTIBODY (ROUTINE TESTING W REFLEX): HIV 1&2 Ab, 4th Generation: NONREACTIVE

## 2015-01-31 LAB — HEPATITIS C ANTIBODY: HCV Ab: NEGATIVE

## 2015-02-02 ENCOUNTER — Encounter: Payer: Self-pay | Admitting: Nurse Practitioner

## 2015-02-03 ENCOUNTER — Telehealth: Payer: Self-pay | Admitting: Nurse Practitioner

## 2015-02-03 NOTE — Telephone Encounter (Signed)
Patient is called about her dose of Provera.  Message was left:  Per Dr. Salli QuarryJertson's recommendation she only needs Provera 2.5 mg per day.  The 5 mg was given to help reduce any BTB as she was adjusting from OCP to HRT.  She will cut the Provera 5 mg in half and still report any BTB and a progress report in 3 months.

## 2015-02-03 NOTE — Progress Notes (Signed)
Encounter reviewed. Discussed considering changing her provera to 2.5 mg a day given that she is on daily HRT.  Gertie ExonJill Jertson, MD

## 2015-02-04 ENCOUNTER — Other Ambulatory Visit: Payer: Self-pay | Admitting: Nurse Practitioner

## 2015-02-04 NOTE — Telephone Encounter (Deleted)
Refused: Rx request for Yaz ; discontinued at last visit on 01/30/2015 by PG//kg

## 2015-03-05 DIAGNOSIS — N94819 Vulvodynia, unspecified: Secondary | ICD-10-CM | POA: Insufficient documentation

## 2015-03-05 DIAGNOSIS — I493 Ventricular premature depolarization: Secondary | ICD-10-CM | POA: Insufficient documentation

## 2015-03-05 DIAGNOSIS — R5381 Other malaise: Secondary | ICD-10-CM | POA: Insufficient documentation

## 2015-03-05 DIAGNOSIS — E892 Postprocedural hypoparathyroidism: Secondary | ICD-10-CM | POA: Insufficient documentation

## 2015-03-05 DIAGNOSIS — R002 Palpitations: Secondary | ICD-10-CM | POA: Insufficient documentation

## 2015-03-05 DIAGNOSIS — I10 Essential (primary) hypertension: Secondary | ICD-10-CM | POA: Insufficient documentation

## 2015-03-05 DIAGNOSIS — G629 Polyneuropathy, unspecified: Secondary | ICD-10-CM | POA: Insufficient documentation

## 2015-03-05 DIAGNOSIS — R5383 Other fatigue: Secondary | ICD-10-CM

## 2015-03-30 ENCOUNTER — Other Ambulatory Visit: Payer: Self-pay | Admitting: Certified Nurse Midwife

## 2015-06-29 DIAGNOSIS — H9319 Tinnitus, unspecified ear: Secondary | ICD-10-CM | POA: Insufficient documentation

## 2015-08-04 ENCOUNTER — Encounter: Payer: Self-pay | Admitting: *Deleted

## 2015-08-04 ENCOUNTER — Encounter: Payer: Self-pay | Admitting: Nurse Practitioner

## 2015-08-07 DIAGNOSIS — Z780 Asymptomatic menopausal state: Secondary | ICD-10-CM | POA: Insufficient documentation

## 2015-10-12 ENCOUNTER — Other Ambulatory Visit: Payer: Self-pay | Admitting: Nurse Practitioner

## 2015-10-12 NOTE — Telephone Encounter (Signed)
Patient returned call to [place]. Patient is taking 0.5  twice daily.

## 2015-10-12 NOTE — Telephone Encounter (Signed)
Patient is requesting a refill of Estradiol. Patient has doubled the dose and needs refills due to this increase.  [facility] [place] [phone].   FYI: Patient has also increased Medroxyprogestrone to 5mg .

## 2015-10-12 NOTE — Telephone Encounter (Signed)
Medication refill request: Estradiol (Estrace) 0.5mg  tabs Last AEX:  01/30/15 PG Next AEX: 02/03/16 PG Last MMG (if hormonal medication request): 07/31/15 BIRADS1, Density C, [facility] Refill authorized: 01/30/15 #90 4R. Patient states she has increased this does to twice daily. She has ran out of her refills. Please advise. Thank you.

## 2015-10-12 NOTE — Telephone Encounter (Signed)
Left message for patient to verify which Estradiol she needs refilled. 10/12/15 -sco

## 2015-10-12 NOTE — Telephone Encounter (Signed)
If she is taking estradiol 0.5 mg BID - when did that change.  I see no note to that effect.  And if this is correct then she needs to be on a higher Progesterone.  She was reduced to 2.5 mg after discussion with Dr. Oscar LaJertson.

## 2015-10-13 NOTE — Telephone Encounter (Signed)
Megan from [facility] is calling to see if she can get the dosage changed  for Estradiol.

## 2015-10-14 NOTE — Telephone Encounter (Signed)
Phone call from BradshawMegan at [facility] regarding refill request for Estradiol.  Aundra MilletMegan states patient has not had RX filled at local pharmacy.  Pt presented to them on 9/18 requesting RX to be filled and can she have 1mg  tab instead of taking two 0.5 mg tablets.  Ellender Hosedvised Megan we will need to contact the patient before RX can be filled and we will contact pharmacy after speaking with patient.  Message left for patient to return call.    RE: when did she increase Estradiol to 0.5 mg BID?

## 2015-10-15 MED ORDER — ESTRADIOL 0.5 MG PO TABS: 0.5000 mg | ORAL_TABLET | Freq: Every day | ORAL | 0 refills | Status: DC

## 2015-10-15 NOTE — Telephone Encounter (Signed)
Return call from patient.  Patient states she increased her Estradiol to 1mg  and Provera to 5 mg in July.  She was having a horrible summer and felt like she was "about to die".  Patient states "my body hurts, but orthopedist says I am fine."  She is having increase joint and muscular aches, mostly in arms, hips and knees.  X-Rays have been normal.  Patient has been having an increase in hot flashes and night sweats.  She has only noticed a slight improvement in symptoms since increasing dose in July.  Patient has noticed increase in libido since increase in medication and is happy with this.    Patient has been out of medication since Sunday, she has noticed she is not remembering things well and is not able to function at her normal at work.  Advised patient she may need office visit to discuss medication changes.  Patient agreeable.  OK to leave message if patient does not answer phone.  Please advise.

## 2015-10-15 NOTE — Telephone Encounter (Signed)
Please have the patient come in for an appointment. I sent in 30 days of the estradiol 0.5 mg. Confirm she has enough provera 2.5 mg a day.

## 2015-10-15 NOTE — Telephone Encounter (Signed)
Return call to patient.  She is agreeable with coming in for an appointment.  Advised we have called in a months worth of medication to get her to that appointment.    Patient is unable to schedule while on the phone. She will call back.  Advised patient that Shirlyn GoltzPatty Grubb, FNP is out of the office until Monday, and we should be able to get her in to see Shirlyn GoltzPatty Grubb, FNP in the next couple of weeks.  Patient is agreeable.  Routing to Shirlyn GoltzPatty Grubb, FNP for review/FYI.  Closing encounter.

## 2016-01-17 ENCOUNTER — Other Ambulatory Visit: Payer: Self-pay | Admitting: Nurse Practitioner

## 2016-01-20 NOTE — Telephone Encounter (Signed)
Medication refill request: Estradiol 0.5mg  Last AEX:  01/30/15 PG Next AEX: 02/03/16 PG Last MMG (if hormonal medication request): 07/31/15 BIRADS1, Density C, Solis Refill authorized: 10/15/15 #30 0R. Please advise. Thank you.   Medication refill request: Medroxyprogesterone 5mg  Last AEX:  01/30/15 PG Next AEX: 02/03/16 PG Last MMG (if hormonal medication request): 07/31/15 BIRADS1, Density C, Solis Refill authorized: 01/30/15 #90 4R. Please advise. Thank you.

## 2016-02-03 ENCOUNTER — Ambulatory Visit: Payer: Managed Care, Other (non HMO) | Admitting: Nurse Practitioner

## 2016-02-04 ENCOUNTER — Encounter: Payer: Self-pay | Admitting: Nurse Practitioner

## 2016-02-04 NOTE — Progress Notes (Signed)
Patient ID: Tabitha LikesRebecca C Clark, female   DOB: [birth year], 56 y.o.   MRN: [id number]  56 y.o. [id number] Married  Caucasian Fe here for annual exam.  Would like to find a new endocrinologist - names are given from [facility] and Dr. Talmage NapBalan and will make a referral when she decides.  She was started on Estradiol 0.5 mg and Provera after going off OCP secondary to vaso symptoms.  Then during the summer the symptoms were quite intolerable and was advised to go back up to 1 mg and Provera 5 mg.  In October she did try to reduce dose again but is till very symptomatic.  Some of this she realizes is due to her thyroid dosing. That is why she would really like to get a specialist evaluation and then try again later to reduce dose of HRT. She had a flare of her desquamative / or chronic vaginitis a few weeks ago and used Cleocin followed with diflucan.  Symptoms are much better now.  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  (she is a former Human resources officeremployee)  Patient's last menstrual period was 12/22/2014 (exact date).          Sexually active: Yes.    The current method of family planning is post menopausal status.    Exercising: No.  The patient does not participate in regular exercise at present. Smoker:  no  Health Maintenance: Pap:12/07/12, Negative with neg HR HPV MMG:07/31/15, 3D, Bi-Rads 1: Negative Colonoscopy: 09/09/11- Normal f/u in 2023 BMD: 06/01/12, T Score 1.4 Spine / 0.7 Right Femur Neck / 1.1 Left Femur Neck TDaP: 2008 Hep C and HIV: 01/30/15 Labs: Endocrinology takes care of all labs   reports that she has never smoked. She has never used smokeless tobacco. She reports that she drinks about 0.5 - 1.0 oz of alcohol per week . She reports that she does not use drugs.  Past Medical History:  Diagnosis Date  . Chronic back pain   . Chronic vulvitis and desquamative vaginitis    bx benign hyperplasia  . HSV-1 infection   .  Hypocalcemia   . IBS (irritable bowel syndrome)   . PVC (premature ventricular contraction) 2004  . Thyroid disease    Hypothyroidism, Hypoparathyroidism  . Yeast infection    Chronic    Past Surgical History:  Procedure Laterality Date  . BASAL CELL CARCINOMA EXCISION Left 05/2013  . BREAST ENHANCEMENT SURGERY  07/2007   Saline  . DIAGNOSTIC LAPAROSCOPY  1988  . HNP Re-Repair  1/10  2009  . thumb surgery Left 1989  . THYROIDECTOMY  10/1995   due to thyroid nodule    Current Outpatient Prescriptions  Medication Sig Dispense Refill  . acyclovir (ZOVIRAX) 400 MG tablet Take 1 tablet (400 mg total) by mouth 2 (two) times daily. 180 tablet 3  . amoxicillin (AMOXIL) 875 MG tablet Take 1 tablet (875 mg total) by mouth as needed. 20 tablet 1  . aspirin EC 81 MG tablet Take 81 mg by mouth daily.    Marland Kitchen. BORIC ACID EX Apply topically as needed.    . calcitRIOL (ROCALTROL) 0.25 MCG capsule daily.     Marland Kitchen. CALCIUM PO Take 600 mg by mouth 2 (two) times daily.     . Cholecalciferol (VITAMIN D PO) Take by mouth.    . clindamycin (CLEOCIN) 2 % vaginal cream USE AT BEDTIME FOR 5 DAYS AS NEEDED. 40  g 4  . cyclobenzaprine (FLEXERIL) 5 MG tablet Take 5 mg by mouth as needed.    . desonide (DESOWEN) 0.05 % ointment Apply topically as needed. 60 g 1  . diazepam (VALIUM) 5 MG tablet Take 5 mg by mouth as needed for muscle spasms.     . Estradiol 10 MCG TABS vaginal tablet Use nightly X 14 then twice a week 18 tablet 11  . hydrochlorothiazide (HYDRODIURIL) 25 MG tablet     . liothyronine (CYTOMEL) 5 MCG tablet Take 1 tablet by mouth daily.    . Magnesium 500 MG CAPS Take by mouth.    . medroxyPROGESTERone (PROVERA) 5 MG tablet Take 0.5 tablet by mouth daily 90 tablet 4  . Multiple Vitamins-Minerals (MULTIVITAMIN PO) Take by mouth.    . Omega-3 Fatty Acids (FISH OIL PO) Take by mouth.    . pregabalin (LYRICA) 50 MG capsule Take 50 mg by mouth at bedtime.     Marland Kitchen SYNTHROID 125 MCG tablet Take 1 tablet by  mouth daily.    Marland Kitchen estradiol (ESTRACE) 1 MG tablet Take 1 tablet (1 mg total) by mouth daily. 90 tablet 4  . fluconazole (DIFLUCAN) 150 MG tablet Take 1 tablet (150 mg total) by mouth once. Take one tablet weekly X 4 12 tablet 4   No current facility-administered medications for this visit.     Family History  Problem Relation Age of Onset  . Hypertension Mother   . Stroke Mother   . Thyroid disease Father     Hyper  . Osteoporosis Father   . Stroke Father   . Diabetes Brother     ROS:  Pertinent items are noted in HPI.  Otherwise, a comprehensive ROS was negative.  Exam:   BP 116/70 (BP Location: Right Arm, Patient Position: Sitting, Cuff Size: Normal)   Pulse 64   Ht 5' 4.75" (1.645 m)   Wt 127 lb (57.6 kg)   LMP 12/22/2014 (Exact Date)   BMI 21.30 kg/m  Height: 5' 4.75" (164.5 cm) Ht Readings from Last 3 Encounters:  02/05/16 5' 4.75" (1.645 m)  01/30/15 5' 4.75" (1.645 m)  01/10/14 5' 4.75" (1.645 m)    General appearance: alert, cooperative and appears stated age Head: Normocephalic, without obvious abnormality, atraumatic Neck: no adenopathy, supple, symmetrical, trachea midline and thyroid normal to inspection and palpation Lungs: clear to auscultation bilaterally Breasts: normal appearance, no masses or tenderness, positive findings: implants bilaterally Heart: regular rate and rhythm Abdomen: soft, non-tender; no masses,  no organomegaly Extremities: extremities normal, atraumatic, no cyanosis or edema Skin: Skin color, texture, turgor normal. No rashes or lesions Lymph nodes: Cervical, supraclavicular, and axillary nodes normal. No abnormal inguinal nodes palpated Neurologic: Grossly normal   Pelvic: External genitalia:  no lesions              Urethra:  normal appearing urethra with no masses, tenderness or lesions              Bartholin's and Skene's: normal                 Vagina: atrophic appearing vagina with normal color and discharge, no lesions               Cervix: anteverted              Pap taken: Yes.   Bimanual Exam:  Uterus:  normal size, contour, position, consistency, mobility, non-tender  Adnexa: no mass, fullness, tenderness               Rectovaginal: Confirms               Anus:  normal sphincter tone, no lesions  Chaperone present: yes  A:  Well Woman with normal exam   Now off oral contraceptives since 11/2014 with significant vaso symptoms and was started on HRT 01/2015 Chronic vaginitis / vulvitis, some better this past year with fewer flares  Atrophic vaginitis and doing well with Vagifem             History of Hypocalcemia with hypoparathyroid             Hypothyroid on replacement - labs are not normal right now and wants to see endocrinologist.  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  Will get estradiol level today. Refill on Vagifem twice a week.  The plan is once thyroid is regulated will go back down on HRT  Counseled with risk of DVT, CVA, cancer, etc.  Refill on her vaginal medication and she is aware of use and treatment plan - with bad flares will often times need Amoxil 875 mg (but since not used this past year did not refill at this time). Does not need a refill of Boric acid or DesOwen at this time. (treatment plan was initiated by Dr. Myrlene Broker).  She is given IFOB today  She will call back to get Endocrinologist referral once she has a schedule in mind.  Counseled on breast self exam, mammography screening, use and side effects of HRT, adequate intake of calcium and vitamin D, diet and exercise, Kegel's exercises return annually or prn  An After Visit Summary was printed and given to the  patient.

## 2016-02-05 ENCOUNTER — Encounter: Payer: Self-pay | Admitting: Nurse Practitioner

## 2016-02-05 ENCOUNTER — Ambulatory Visit (INDEPENDENT_AMBULATORY_CARE_PROVIDER_SITE_OTHER): Payer: Managed Care, Other (non HMO) | Admitting: Nurse Practitioner

## 2016-02-05 VITALS — BP 116/70 | HR 64 | Ht 64.75 in | Wt 127.0 lb

## 2016-02-05 DIAGNOSIS — Z0000 Encounter for general adult medical examination without abnormal findings: Secondary | ICD-10-CM

## 2016-02-05 DIAGNOSIS — Z79890 Hormone replacement therapy (postmenopausal): Secondary | ICD-10-CM

## 2016-02-05 DIAGNOSIS — Z01419 Encounter for gynecological examination (general) (routine) without abnormal findings: Secondary | ICD-10-CM

## 2016-02-05 DIAGNOSIS — N951 Menopausal and female climacteric states: Secondary | ICD-10-CM

## 2016-02-05 DIAGNOSIS — Z1211 Encounter for screening for malignant neoplasm of colon: Secondary | ICD-10-CM | POA: Diagnosis not present

## 2016-02-05 MED ORDER — MEDROXYPROGESTERONE ACETATE 5 MG PO TABS: ORAL_TABLET | ORAL | 4 refills | Status: DC

## 2016-02-05 MED ORDER — CLINDAMYCIN PHOSPHATE 2 % VA CREA: TOPICAL_CREAM | VAGINAL | 4 refills | Status: DC

## 2016-02-05 MED ORDER — ESTRADIOL 1 MG PO TABS: 1.0000 mg | ORAL_TABLET | Freq: Every day | ORAL | 4 refills | Status: DC

## 2016-02-05 MED ORDER — ACYCLOVIR 400 MG PO TABS: 400.0000 mg | ORAL_TABLET | Freq: Two times a day (BID) | ORAL | 3 refills | Status: DC

## 2016-02-05 MED ORDER — ESTRADIOL 10 MCG VA TABS: ORAL_TABLET | VAGINAL | 11 refills | Status: DC

## 2016-02-05 MED ORDER — FLUCONAZOLE 150 MG PO TABS: 150.0000 mg | ORAL_TABLET | Freq: Once | ORAL | 4 refills | Status: AC

## 2016-02-05 NOTE — Patient Instructions (Signed)

## 2016-02-06 LAB — ESTRADIOL: ESTRADIOL: 39 pg/mL

## 2016-02-07 NOTE — Progress Notes (Signed)
Encounter reviewed by Dr. Brook Amundson C. Silva.  

## 2016-02-08 ENCOUNTER — Other Ambulatory Visit: Payer: Self-pay

## 2016-02-08 MED ORDER — ESTRADIOL 10 MCG VA TABS: ORAL_TABLET | VAGINAL | 4 refills | Status: DC

## 2016-02-08 NOTE — Telephone Encounter (Signed)
Fax received from pharmacy stating that they require 90-day supply. Would like a new prescription.   Medication refill request: Estradiol Vag Tabs 10mcg Last AEX:  02/05/16 PG Next AEX: 02/10/17  Last MMG (if hormonal medication request): 07/31/15 BIRADS 1 negative Refill authorized: please advise for 90-day supply

## 2016-02-10 LAB — IPS PAP TEST WITH HPV

## 2016-02-15 ENCOUNTER — Other Ambulatory Visit: Payer: Self-pay

## 2016-02-15 MED ORDER — ESTRADIOL 10 MCG VA TABS: ORAL_TABLET | VAGINAL | 4 refills | Status: DC

## 2016-02-18 LAB — FECAL OCCULT BLOOD, IMMUNOCHEMICAL: IMMUNOLOGICAL FECAL OCCULT BLOOD TEST: NEGATIVE

## 2016-02-18 NOTE — Addendum Note (Signed)
Addended by: Zenovia JordanMITCHELL, Ilah Boule A on: 02/18/2016 03:46 PM   Modules accepted: Orders

## 2016-03-04 ENCOUNTER — Other Ambulatory Visit: Payer: Self-pay | Admitting: Nurse Practitioner

## 2016-04-01 DIAGNOSIS — Z79899 Other long term (current) drug therapy: Secondary | ICD-10-CM | POA: Insufficient documentation

## 2016-04-13 ENCOUNTER — Other Ambulatory Visit: Payer: Self-pay | Admitting: Nurse Practitioner

## 2016-04-29 ENCOUNTER — Telehealth: Payer: Self-pay | Admitting: Nurse Practitioner

## 2016-04-29 NOTE — Telephone Encounter (Signed)
Patient would like to see Dr Shella Maxim at [facility] for her endocrinolgy referral.

## 2016-04-29 NOTE — Telephone Encounter (Signed)
Please enter referral based on office note from 02-05-16.  Patient was to call with provider choice. Patient would like to see Dr. Elvera Lennox.

## 2016-05-02 NOTE — Telephone Encounter (Signed)
Agree with referral - will you call for that apt.

## 2016-05-04 ENCOUNTER — Other Ambulatory Visit: Payer: Self-pay | Admitting: Nurse Practitioner

## 2016-05-04 NOTE — Telephone Encounter (Signed)
New referral must be entered in order to process. Once entered, will call for appointment.

## 2016-05-04 NOTE — Telephone Encounter (Signed)
I am trying to put in Dr. Shella Maxim and find no matches for her.  Do you have any information?

## 2016-05-06 ENCOUNTER — Other Ambulatory Visit: Payer: Self-pay | Admitting: Nurse Practitioner

## 2016-05-06 DIAGNOSIS — E039 Hypothyroidism, unspecified: Secondary | ICD-10-CM

## 2016-06-17 DIAGNOSIS — R0789 Other chest pain: Secondary | ICD-10-CM | POA: Insufficient documentation

## 2016-06-17 DIAGNOSIS — J690 Pneumonitis due to inhalation of food and vomit: Secondary | ICD-10-CM | POA: Insufficient documentation

## 2016-06-24 ENCOUNTER — Encounter: Payer: Self-pay | Admitting: Internal Medicine

## 2016-06-24 ENCOUNTER — Ambulatory Visit (INDEPENDENT_AMBULATORY_CARE_PROVIDER_SITE_OTHER): Payer: Managed Care, Other (non HMO) | Admitting: Internal Medicine

## 2016-06-24 VITALS — BP 134/70 | HR 71 | Ht 64.75 in | Wt 127.8 lb

## 2016-06-24 DIAGNOSIS — E892 Postprocedural hypoparathyroidism: Secondary | ICD-10-CM

## 2016-06-24 DIAGNOSIS — E890 Postprocedural hypothyroidism: Secondary | ICD-10-CM

## 2016-06-24 NOTE — Patient Instructions (Addendum)
Please stop at the lab.  Please continue Synthroid 100 mcg + Cytomel generic 5 mcg daily.  Please come back for a follow-up appointment in 4 months.

## 2016-06-24 NOTE — Progress Notes (Signed)
Patient ID: Tabitha Clark, female   DOB: 11/11/1960, 56 y.o.   MRN: [id number]    HPI  Tabitha Clark is a 56 y.o.-year-old very pleasant female, referred by Ria Comment, FNP,, for management of postsurgical hypothyroidism and hypoparathyroidism, dx'ed in 1997 after thyroidectomy for a thyroid nodule (turned out to be benign).  Postsurgical hypothyroidism   She is on Synthroid DAW 100 mcg + Cytomel generic 5 mcg daily. She has tried Levothyroxine generic >> AP. She needs to have the LT3 as OTW, she feels exhausted, cannot function.  She takes the thyroid hormone: - every day - fasting - with water - separated by >60-75 min from b'fast  - + calcium, multivitamins - both with b'fast - takes these every day - no Fe, no PPI  I reviewed pt's thyroid tests: 04/11/2016: TSH 0.407 06/29/2015: TSH 0.03, free t4: 1.06, free t3 2.58 (LT4 dose decreased to 100 mcg daily) No results found for: TSH, FREET4   TPO Abs (03/07/2014): 37 (<34).  Pt denies: - weight gain  - fatigue - cold intolerance - depression - constipation - dry skin - hair loss  She had PVCs with decrease in LT4 dose (!).  She stopped OCPs in 2016 and had diet change (eliminated gluten and dairy) >> lost weight.  Pt denies feeling nodules in neck, hoarseness, dysphagia/odynophagia, SOB with lying down.  She has + FH of thyroid disorders in: father >> Htyr. No FH of thyroid cancer.  No h/o radiation tx to head or neck. No recent use of iodine supplements.  She also has postsurgical hypoparathyroidism.  She is on: - vitamin D + Ca (Caltrate): 400 units + 600 mg 2x a day - MVI: 200 mg calcium + ? Vitamin D - Calcitriol 0.25 mcg daily in am, with b'fast - Magnesium 500 mg at night - HCTZ 25 mg in am  Latest calcium levels: 04/01/2016: Ca 9.0 06/29/2015: Ca 10.5, PTH <1  Last 24h Urinary Calcium/Cr: 08/14/2013: 137 06/11/2012: 132  She had few instances of high calcium >> she has GERD then.  She  may get hypocalcemic sxs if she gets dehydrated. Last episode 10 years ago.   ROS: Constitutional: + see HPI Eyes: no blurry vision, no xerophthalmia ENT: no sore throat, + see HPI, + hypoacusis, + tinnitus Cardiovascular: no CP/SOB/palpitations/leg swelling Respiratory: no cough/SOB Gastrointestinal: no N/V/D/C Musculoskeletal: + muscle/+ joint aches Skin: no rashes Neurological: no tremors/numbness/tingling/dizziness Psychiatric: no depression/anxiety  Past Medical History:  Diagnosis Date  . Chronic back pain   . Chronic vulvitis and desquamative vaginitis    bx benign hyperplasia  . HSV-1 infection   . Hypocalcemia   . IBS (irritable bowel syndrome)   . PVC (premature ventricular contraction) 2004  . Thyroid disease    Hypothyroidism, Hypoparathyroidism  . Yeast infection    Chronic   Past Surgical History:  Procedure Laterality Date  . BASAL CELL CARCINOMA EXCISION Left 05/2013  . BREAST ENHANCEMENT SURGERY  07/2007   Saline  . DIAGNOSTIC LAPAROSCOPY  1988  . HNP Re-Repair  1/10  2009  . thumb surgery Left 1989  . THYROIDECTOMY  10/1995   due to thyroid nodule  L4-L5 laser Sx 2009 and 2010 L radial nerve repair 1989  Social History   Social History  . Marital status: Married    Spouse name: N/A  . Number of children: 2   Occupational History  . 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 Smoking status: Never Smoker  .  Smokeless tobacco: Never Used  . Alcohol use          Comment: 1-2 glasses of wine every mo  . Drug use: No  . 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 acyclovir (ZOVIRAX) 400 MG tablet Take 1 tablet (400 mg total) by mouth 2 (two) times daily. (Patient taking differently: Take 400 mg  by mouth 2 (two) times daily. Takes as needed) 180 tablet 3  . amoxicillin (AMOXIL) 875 MG tablet Take 1 tablet (875 mg total) by mouth as needed. 20 tablet 1  . aspirin EC 81 MG tablet Take 81 mg by mouth daily.    Marland Kitchen. BORIC ACID EX Apply topically as needed.    . calcitRIOL (ROCALTROL) 0.25 MCG capsule daily.     Marland Kitchen. CALCIUM PO Take 600 mg by mouth 2 (two) times daily.     . Cholecalciferol (VITAMIN D PO) Take by mouth.    . clindamycin (CLEOCIN) 2 % vaginal cream USE AT BEDTIME FOR 5 DAYS AS NEEDED. 40 g 4  . cyclobenzaprine (FLEXERIL) 5 MG tablet Take 5 mg by mouth as needed.    . desonide (DESOWEN) 0.05 % ointment Apply topically as needed. 60 g 1  . diazepam (VALIUM) 5 MG tablet Take 5 mg by mouth as needed for muscle spasms.     Marland Kitchen. estradiol (ESTRACE) 1 MG tablet Take 1 tablet (1 mg total) by mouth daily. 90 tablet 4  . Estradiol 10 MCG TABS vaginal tablet Use nightly X 14 then twice a week 36 tablet 4  . hydrochlorothiazide (HYDRODIURIL) 25 MG tablet     . liothyronine (CYTOMEL) 5 MCG tablet Take 1 tablet by mouth daily.    . Magnesium 500 MG CAPS Take by mouth.    . medroxyPROGESTERone (PROVERA) 5 MG tablet Take 0.5 tablet by mouth daily 90 tablet 4  . Multiple Vitamins-Minerals (MULTIVITAMIN PO) Take by mouth.    . Omega-3 Fatty Acids (FISH OIL PO) Take by mouth.    . pregabalin (LYRICA) 50 MG capsule Take 50 mg by mouth at bedtime.     Marland Kitchen. SYNTHROID 125 MCG tablet Take 1 tablet by mouth daily.     No current facility-administered medications on file prior to visit.    Allergies  Allergen Reactions  . Codeine Nausea Only  . Famotidine Hives  . Macrobid [Nitrofurantoin] Nausea Only  . Nsaids   . Sulfa Antibiotics Nausea Only  . Prednisone Rash   Family History  Problem Relation Age of Onset  . Hypertension Mother   . Stroke Mother   . Thyroid disease Father        Hyper  . Osteoporosis Father   . Stroke Father   . Diabetes Brother     PE: BP 134/70   Pulse 71   Ht 5'  4.75" (1.645 m)   Wt 127 lb 12.8 oz (58 kg)   LMP 12/22/2014 (Exact Date)   SpO2 98%   BMI 21.43 kg/m  Wt Readings from Last 3 Encounters:  06/24/16 127 lb 12.8 oz (58 kg)  02/05/16 127 lb (57.6 kg)  01/30/15 131 lb (59.4 kg)   Constitutional: normal weight, in NAD Eyes: PERRLA, EOMI, no exophthalmos ENT: moist mucous membranes, no thyromegaly, no  cervical lymphadenopathy Cardiovascular: RRR, No MRG Respiratory: CTA B Gastrointestinal: abdomen soft, NT, ND, BS+ Musculoskeletal: no deformities, strength intact in all 4 Skin: moist, warm, no rashes Neurological: no tremor with outstretched hands, DTR normal in all 4  ASSESSMENT: 1. Postsurgical hypothyroidism  2. Postsurgical hypoparathyroidism  PLAN:  1. Patient with long-standing hypothyroidism, developed after her thyroidectomy in 1997 (benign path) - she is on a mixed regimen: LT4 (DAW) 100 mcg daily + LT3 (generic) 5 mcg daily. Off LT3 in the past >> severe fatigue. - we reviewed together the TFTs available in Epic >> last 2 TSH levels are low, but last was improved on the current thyroid hh dose. We discussed about the need for normal TFTs as we reviewed SEs of overreplacement with LT4 and LT3. We decided to try to aim for low normal TSH but to make small changes in her thyroid hh doses.  - she does not appear to have a goiter, thyroid nodules, or neck compression symptoms; she appears euthyroid. - We discussed about correct intake of levothyroxine, fasting, with water, separated by at least 30 minutes from breakfast, and separated by more than 4 hours from calcium, iron, multivitamins, acid reflux medications (PPIs). She is taking her calcium and MVI with b'fast, which is 1-1.5h after thyroid meds, but we can continue with this if she does not skip doses of the MVI and Calcium. - will check thyroid tests today: TSH, free T4, free T3 - If labs today are abnormal, she will need to return in ~6 weeks for repeat labs - Otherwise, I  will see her back in 4 months  2. Postsurgical hypoparathyroidism - well controlled on her current regimen: see HPI; no need for Natpara - reviewed together most recent calcium levels, last 2 24h Urine calcium levels  - we decided to continue on current regimen for now - at next visit, I plan to check vitamin D, ionized calcium, phosphorus, magnesium and a 24 unine calcium  Needs PA and Rx sent to [facility]. Needs Rx for LT3 and Rocaltrol.  [facility]  from 06/24/2016: - TSH 0.853 - TT4 7.2  both normal, so we can continue the current dose of thyroid hormones.  Carlus Pavlov, MD PhD [facility]

## 2016-06-27 MED ORDER — LIOTHYRONINE SODIUM 5 MCG PO TABS: 5.0000 ug | ORAL_TABLET | Freq: Every day | ORAL | 3 refills | Status: DC

## 2016-06-27 MED ORDER — SYNTHROID 100 MCG PO TABS: 100.0000 ug | ORAL_TABLET | Freq: Every day | ORAL | 3 refills | Status: DC

## 2016-06-27 MED ORDER — CALCITRIOL 0.25 MCG PO CAPS: 0.2500 ug | ORAL_CAPSULE | Freq: Every day | ORAL | 3 refills | Status: DC

## 2016-06-29 ENCOUNTER — Telehealth: Payer: Self-pay

## 2016-06-29 NOTE — Telephone Encounter (Signed)
Called and notified patient we had received denial for her copay lowered for her synthroid. Patient understood and was calling her insurance to find out what else we could do. I placed the letter in to be scanned.

## 2016-08-05 ENCOUNTER — Encounter: Payer: Self-pay | Admitting: Internal Medicine

## 2016-08-08 ENCOUNTER — Other Ambulatory Visit: Payer: Self-pay

## 2016-08-08 MED ORDER — SYNTHROID 100 MCG PO TABS: 100.0000 ug | ORAL_TABLET | Freq: Every day | ORAL | 3 refills | Status: DC

## 2016-08-23 ENCOUNTER — Telehealth: Payer: Self-pay | Admitting: Obstetrics & Gynecology

## 2016-08-23 NOTE — Telephone Encounter (Signed)
Left message regarding upcoming appointment has been canceled and needs to be rescheduled. °

## 2016-09-17 ENCOUNTER — Encounter: Payer: Self-pay | Admitting: Internal Medicine

## 2016-09-19 ENCOUNTER — Other Ambulatory Visit: Payer: Self-pay

## 2016-09-19 MED ORDER — HYDROCHLOROTHIAZIDE 25 MG PO TABS: 25.0000 mg | ORAL_TABLET | Freq: Every day | ORAL | 3 refills | Status: DC

## 2016-09-28 ENCOUNTER — Encounter: Payer: Self-pay | Admitting: Obstetrics and Gynecology

## 2016-10-28 ENCOUNTER — Ambulatory Visit: Payer: Managed Care, Other (non HMO) | Admitting: Internal Medicine

## 2016-11-03 ENCOUNTER — Other Ambulatory Visit: Payer: Self-pay | Admitting: *Deleted

## 2016-11-03 NOTE — Telephone Encounter (Signed)
Medication refill request: estradiol  tab Last AEX:  02/05/16 PG Next AEX: none Last MMG (if hormonal medication request): 09/16/16 [place] Bx right: fibroadenoma. F/u 6 months Refill authorized: 02/05/16 #90tabs/4R.  Patient should have enough refills until 01/2017.

## 2017-01-13 ENCOUNTER — Ambulatory Visit: Payer: Managed Care, Other (non HMO) | Admitting: Internal Medicine

## 2017-01-13 ENCOUNTER — Encounter: Payer: Self-pay | Admitting: Internal Medicine

## 2017-01-13 VITALS — BP 120/70 | HR 83 | Ht 65.0 in | Wt 127.4 lb

## 2017-01-13 DIAGNOSIS — E890 Postprocedural hypothyroidism: Secondary | ICD-10-CM | POA: Diagnosis not present

## 2017-01-13 DIAGNOSIS — E892 Postprocedural hypoparathyroidism: Secondary | ICD-10-CM | POA: Diagnosis not present

## 2017-01-13 LAB — VITAMIN D 25 HYDROXY (VIT D DEFICIENCY, FRACTURES): VITD: 55.53 ng/mL (ref 30.00–100.00)

## 2017-01-13 LAB — TSH: TSH: 0.9 u[IU]/mL (ref 0.35–4.50)

## 2017-01-13 LAB — T3, FREE: T3, Free: 2.9 pg/mL (ref 2.3–4.2)

## 2017-01-13 LAB — T4, FREE: FREE T4: 1.11 ng/dL (ref 0.60–1.60)

## 2017-01-13 LAB — PHOSPHORUS: Phosphorus: 3.6 mg/dL (ref 2.3–4.6)

## 2017-01-13 LAB — CALCIUM: Calcium: 8.3 mg/dL — ABNORMAL LOW (ref 8.4–10.5)

## 2017-01-13 LAB — MAGNESIUM: Magnesium: 1.8 mg/dL (ref 1.5–2.5)

## 2017-01-13 MED ORDER — LIOTHYRONINE SODIUM 5 MCG PO TABS: 5.0000 ug | ORAL_TABLET | Freq: Every day | ORAL | 3 refills | Status: DC

## 2017-01-13 MED ORDER — SYNTHROID 100 MCG PO TABS: 100.0000 ug | ORAL_TABLET | Freq: Every day | ORAL | 3 refills | Status: DC

## 2017-01-13 NOTE — Progress Notes (Addendum)
Patient ID: Tabitha Clark, female   DOB: 07/06/1960, 56 y.o.   MRN: [id number]    HPI  [name] is a 56 y.o.-year-old very pleasant female, initially referred by Ria Comment, FNP, returning for follow-up for postsurgical hypothyroidism and hypoparathyroidism, dx'ed in 1997 after thyroidectomy for a thyroid nodule (turned out to be benign).  Last visit 6 months ago.  Postsurgical hypothyroidism   She is on Synthroid d.a.w. 100 mcg + liothyronine generic 5 mcg daily. She has tried Levothyroxine generic >> abdominal pain. She needs to have the LT3 as OTW, she feels exhausted, cannot function.  She takes the thyroid hormones: -Daily -Fasting -With water -separated by more than an hour from breakfast -+ Calcium and multivitamins with breakfast-takes these every day -No iron, no PPIs  I reviewed pt's thyroid tests: 06/24/2016: TSH 0.853 04/11/2016: TSH 0.407 06/29/2015: TSH 0.03, free t4: 1.06, free t3 2.58 (LT4 dose decreased to 100 mcg daily) No results found for: TSH, FREET4   TPO Abs (03/07/2014): 37 (<34).  She had PVCs with decrease in levothyroxine dose (!).  She stopped OCPs in 2016 and had diet change (eliminated gluten and dairy) >> lost weight.  Pt denies: - feeling nodules in neck - hoarseness - dysphagia - choking - SOB with lying down  She has + FH of thyroid disorders in: father >> Htyr.No FH of thyroid cancer. No h/o radiation tx to head or neck.  No seaweed or kelp. No recent contrast studies. No herbal supplements. No Biotin use. No recent steroids use.   She also has postsurgical hypoparathyroidism.  She is on: - vitamin D + Ca (Caltrate): 400 units +600 mg 1 in am and 1/2 at night (decreased from 1 tab at night b/c metallic taste) - Multivitamin: 200 mg calcium + ? Vitamin D - calcitriol 0.25 mcg daily with breakfast - Magnesium 500 mg at night  - HCTZ 25 mg in a.m. >> no dehydration, but has dizziness (she belives she has Meniere's ds.) - 3  episodes in last 2 years  Latest calcium levels: No results found for: CALCIUM, PHOS 04/01/2016: Ca 9.0 06/29/2015: Ca 10.5, PTH <1  Last vitamin D: No results found for: VD25OH   Last 24h Urinary Calcium/Cr: 08/14/2013: 137 06/11/2012: 132  She had few instances of high calcium>> she has acid reflux symptoms then.  She may get hypoglycemic symptoms if she gets dehydrated Last episode more than 10 years ago.  ROS: Constitutional: no weight gain/no weight loss, no fatigue, no subjective hyperthermia, no subjective hypothermia Eyes: no blurry vision, no xerophthalmia ENT: no sore throat, + see HPI Cardiovascular: no CP/no SOB/no palpitations/no leg swelling Respiratory: no cough/no SOB/no wheezing Gastrointestinal: no N/no V/no D/no C/no acid reflux Musculoskeletal: no muscle aches/no joint aches Skin: no rashes, no hair loss Neurological: no tremors/no numbness/no tingling/no dizziness  I reviewed pt's medications, allergies, PMH, social hx, family hx, and changes were documented in the history of present illness. Otherwise, unchanged from my initial visit note.  Past Medical History:  Diagnosis Date  . Chronic back pain   . Chronic vulvitis and desquamative vaginitis    bx benign hyperplasia  . HSV-1 infection   . Hypocalcemia   . IBS (irritable bowel syndrome)   . PVC (premature ventricular contraction) 2004  . Thyroid disease    Hypothyroidism, Hypoparathyroidism  . Yeast infection    Chronic   Past Surgical History:  Procedure Laterality Date  . BASAL CELL CARCINOMA EXCISION Left 05/2013  . BREAST  ENHANCEMENT SURGERY  07/2007   Saline  . DIAGNOSTIC LAPAROSCOPY  1988  . HNP Re-Repair  1/10  2009  . thumb surgery Left 1989  . THYROIDECTOMY  10/1995   due to thyroid nodule  L4-L5 laser Sx 2009 and 2010 L radial nerve repair 1989  Social History   Social History  . Marital status: Married    Spouse name: N/A  . Number of children: 2   Occupational History   . 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 Smoking status: Never Smoker  . Smokeless tobacco: Never Used  . Alcohol use          Comment: 1-2 glasses of wine every mo  . Drug use: No  . 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 acyclovir (ZOVIRAX) 400 MG tablet Take 1 tablet (400 mg total) by mouth 2 (two) times daily. (Patient taking differently: Take 400 mg by mouth 2 (two) times daily. Takes as needed) 180 tablet 3  . amoxicillin (AMOXIL) 875 MG tablet Take 1 tablet (875 mg total) by mouth as needed. 20 tablet 1  . aspirin EC 81 MG tablet Take 81 mg by mouth daily.    Marland Kitchen. BORIC ACID EX Apply topically as needed.    . calcitRIOL (ROCALTROL) 0.25 MCG capsule Take 1 capsule (0.25 mcg total) by mouth daily. 90 capsule 3  . CALCIUM PO Take 600 mg by mouth 2 (two) times daily.     . Cholecalciferol (VITAMIN D PO) Take by mouth.    . clindamycin (CLEOCIN) 2 % vaginal cream USE AT BEDTIME FOR 5 DAYS AS NEEDED. 40 g 4  . cyclobenzaprine (FLEXERIL) 5 MG tablet Take 5 mg by mouth as needed.    . desonide (DESOWEN) 0.05 % ointment Apply topically as needed. 60 g 1  . diazepam (VALIUM) 5 MG tablet Take 5 mg by mouth as needed for muscle spasms.     Marland Kitchen. estradiol (ESTRACE) 1 MG tablet Take 1 tablet (1 mg total) by mouth daily. 90 tablet 4  . Estradiol 10 MCG TABS vaginal tablet Use nightly X 14 then twice a week 36 tablet 4  . hydrochlorothiazide (HYDRODIURIL) 25 MG tablet Take 1 tablet (25 mg total) by mouth daily. 90 tablet 3  . liothyronine (CYTOMEL) 5 MCG tablet Take 1 tablet (5 mcg total) by mouth daily. 90 tablet 3  . Magnesium 500 MG CAPS Take by mouth.    . medroxyPROGESTERone (PROVERA) 5 MG tablet Take 0.5 tablet by mouth daily 90 tablet  4  . Multiple Vitamins-Minerals (MULTIVITAMIN PO) Take by mouth.    . Omega-3 Fatty Acids (FISH OIL PO) Take by mouth.    . pregabalin (LYRICA) 50 MG capsule Take 50 mg by mouth at bedtime.     Marland Kitchen. SYNTHROID 100 MCG tablet Take 1 tablet (100 mcg total) by mouth daily before breakfast. 90 tablet 3   No current facility-administered medications on file prior to visit.    Allergies  Allergen Reactions  . Famotidine Hives  . Macrobid [Nitrofurantoin] Nausea Only  . Nsaids   . Sulfa Antibiotics Nausea Only  . Prednisone Rash   Family History  Problem Relation Age of Onset  . Hypertension Mother   .  Stroke Mother   . Thyroid disease Father        Hyper  . Osteoporosis Father   . Stroke Father   . Diabetes Brother     PE: LMP 12/22/2014 (Exact Date)  Wt Readings from Last 3 Encounters:  06/24/16 127 lb 12.8 oz (58 kg)  02/05/16 127 lb (57.6 kg)  01/30/15 131 lb (59.4 kg)   Constitutional: normal weight, in NAD Eyes: PERRLA, EOMI, no exophthalmos ENT: moist mucous membranes, no thyromegaly, no cervical lymphadenopathy Cardiovascular: RRR, No MRG Respiratory: CTA B Gastrointestinal: abdomen soft, NT, ND, BS+ Musculoskeletal: no deformities, strength intact in all 4 Skin: moist, warm, no rashes Neurological: no tremor with outstretched hands, DTR normal in all 4  ASSESSMENT: 1. Postsurgical hypothyroidism  2. Postsurgical hypoparathyroidism  PLAN:  1. Patient with long-standing hypothyroidism, developed after her thyroidectomy in 1997 (benign pathology).  She is on a mixed regimen of Synthroid (d.a.w. 100 mcg daily and liothyronine generic) 5 mcg daily.  The equivalent total dose of thyroid hormones is 120 mcg levothyroxine.  She has tried to come off Cytomel in the past but she developed severe fatigue. She also tried generic LT4 >> AP. - latest thyroid labs reviewed with pt >> normal in 06/2016 - pt feels good on these medication doses. - we discussed about taking the  thyroid hormone every day, with water, >30 minutes before breakfast, separated by >4 hours from acid reflux medications, calcium, iron, multivitamins. Pt. is taking it correctly, but takes Ca earlier, however, she does this every morning. - will check thyroid tests today: TSH, free T3 and fT4 - If labs are abnormal, she will need to return for repeat TFTs in 1.5 months - OTW, RTC in 6 months  2. Postsurgical hypoparathyroidism - Well-controlled on her current regimen (please see HPI), no need for Natpara - We reviewed together her most recent calcium levels and she is due for another one.  We will order this today. - no perioral numbness or hand cramping. - We will continue current regimen for now pending labs - At this visit, we will check the following labs:  Lab Orders     VITAMIN D 25 Hydroxy (Vit-D Deficiency, Fractures)     Phosphorus     Calcium     TSH     T4, free     T3, free     Magnesium     Creatinine, urine, 24 hour     Calcium, urine, 24 hour  Needs refills for LT4, LT3 and HCTZ.  Component     Latest Ref Rng & Units 01/13/2017  VITD     30.00 - 100.00 ng/mL 55.53  Phosphorus     2.3 - 4.6 mg/dL 3.6  Calcium     8.4 - 10.5 mg/dL 8.3 (L)  TSH     1.610.35 - 4.50 uIU/mL 0.90  T4,Free(Direct)     0.60 - 1.60 ng/dL 0.961.11  Triiodothyronine,Free,Serum     2.3 - 4.2 pg/mL 2.9  Magnesium     1.5 - 2.5 mg/dL 1.8   TFTs normal. Vit D great! Ca a little low, but not worrisome and actually at goal for postsurgical hypocalcemia. Phosphorus and Magnesium normal.  UCa normal: Component     Latest Ref Rng & Units 02/10/2017  Calcium, 24H Urine     mg/24 h 235  Creatinine, 24H Ur     0.50 - 2.15 g/24 h 1.34    Carlus Pavlovristina Evangelina Delancey, MD PhD [facility]

## 2017-01-13 NOTE — Patient Instructions (Addendum)
Please stop at the lab.  Please collect a 24h urine.  Continue the same doses of calcium, magnesium, HCTZ, calcitriol and vitamin D.  Continue Synthroid 100 mcg + Liothyronine 5 mcg daily.  Take the thyroid hormone every day, with water, at least 30 minutes before breakfast, separated by at least 4 hours from: - acid reflux medications - calcium - iron - multivitamins  Please come back for a follow-up appointment in 6 months.

## 2017-01-16 ENCOUNTER — Telehealth: Payer: Self-pay

## 2017-01-16 NOTE — Telephone Encounter (Signed)
Received a denial for Synthroid 100mcg for patient. Please advise on if a PA is neccesary  NWG:[id number]

## 2017-01-18 NOTE — Telephone Encounter (Signed)
PA sent today

## 2017-01-18 NOTE — Telephone Encounter (Signed)
OK, let's try a PA.

## 2017-01-19 NOTE — Telephone Encounter (Signed)
A PA was not required but it does state that the patient is only allowed to pick up a max of 34 day supply from pharmacy and is not eligible for a refill until 02/18/17. Pt is aware.

## 2017-02-10 ENCOUNTER — Ambulatory Visit: Payer: Managed Care, Other (non HMO) | Admitting: Nurse Practitioner

## 2017-02-10 NOTE — Addendum Note (Signed)
Addended by: Adline MangoSTONE-ELMORE, Demetrius Mahler I on: 02/10/2017 03:25 PM   Modules accepted: Orders

## 2017-02-11 LAB — CALCIUM, URINE, 24 HOUR: CALCIUM 24H UR: 235 mg/(24.h)

## 2017-02-11 LAB — EXTRA URINE SPECIMEN

## 2017-02-11 LAB — CREATININE, URINE, 24 HOUR: Creatinine, 24H Ur: 1.34 g/(24.h) (ref 0.50–2.15)

## 2017-02-23 NOTE — Progress Notes (Signed)
57 y.o. G29P2002 Married  Caucasian Fe here for annual exam. Menopausal on HRT. Occasional hot flashes. Denies vaginal bleeding or vaginal dryness. Vagifem working well, desires continuance. 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 Will need refills on Rx for the next year. Works well. HRT working well, no issues. Sees Dr.Grisso PCP for aex, hypertension(HCTZ only), flexaril management and labs. Leaving for [place] next week. No other health issues today.  Patient's last menstrual period was 12/22/2014 (exact date).          Sexually active: Yes.    The current method of family planning is post menopausal status.    Exercising: No.  exercise Smoker:  no  Health Maintenance: Pap:  12-07-12 neg HPV HR neg, 02-05-16 neg HPV HR neg History of Abnormal Pap: yes MMG:  8/18 bilateral mmg & rt breast biopsy, f/u recommended, fibroadenoma Self Breast exams: yes Colonoscopy: 2013  BMD:   2014 TDaP:  UTD Shingles: no Pneumonia: no Hep C and HIV: both neg 2017 Labs: no   reports that  has never smoked. she has never used smokeless tobacco. She reports that she drinks alcohol. She reports that she does not use drugs.  Past Medical History:  Diagnosis Date  . Chronic back pain   . Chronic vulvitis and desquamative vaginitis    bx benign hyperplasia  . HSV-1 infection   . Hypocalcemia   . IBS (irritable bowel syndrome)   . PVC (premature ventricular contraction) 2004  . Thyroid disease    Hypothyroidism, Hypoparathyroidism  . Yeast infection    Chronic    Past Surgical History:  Procedure Laterality Date  . BASAL CELL CARCINOMA EXCISION Left 05/2013  . BREAST ENHANCEMENT SURGERY  07/2007   Saline  . DIAGNOSTIC LAPAROSCOPY  1988  . HNP Re-Repair  1/10  2009  . thumb surgery Left 1989  . THYROIDECTOMY  10/1995   due to thyroid nodule    Current Outpatient Medications  Medication Sig Dispense Refill  .  acyclovir (ZOVIRAX) 400 MG tablet Take 1 tablet (400 mg total) by mouth 2 (two) times daily. 180 tablet 3  . aspirin EC 81 MG tablet Take 81 mg by mouth daily.    Marland Kitchen BORIC ACID EX Apply topically as needed.    . calcitRIOL (ROCALTROL) 0.25 MCG capsule Take 1 capsule (0.25 mcg total) by mouth daily. 90 capsule 3  . CALCIUM PO Take 600 mg by mouth 2 (two) times daily.     . clindamycin (CLEOCIN) 2 % vaginal cream USE AT BEDTIME FOR 5 DAYS AS NEEDED. 40 g 4  . cyclobenzaprine (FLEXERIL) 5 MG tablet Take 5 mg by mouth as needed.    . desonide (DESOWEN) 0.05 % ointment Apply topically as needed. 60 g 1  . estradiol (ESTRACE) 1 MG tablet Take 1 tablet (1 mg total) by mouth daily. 90 tablet 4  . Estradiol 10 MCG TABS vaginal tablet Use nightly X 14 then twice a week 36 tablet 4  . fluconazole (DIFLUCAN) 150 MG tablet Take 150 mg by mouth as needed.    . hydrochlorothiazide (HYDRODIURIL) 25 MG tablet Take 1 tablet (25 mg total) by mouth daily. 90 tablet 3  . liothyronine (CYTOMEL) 5 MCG tablet Take 1 tablet (5 mcg total) by mouth daily. 90 tablet 3  . Magnesium 500 MG CAPS Take by mouth.    . medroxyPROGESTERone (PROVERA) 5 MG tablet Take 5 mg by  mouth daily.    . Multiple Vitamins-Minerals (MULTIVITAMIN PO) Take by mouth.    . Omega-3 Fatty Acids (FISH OIL PO) Take by mouth.    . pregabalin (LYRICA) 50 MG capsule Take 50 mg by mouth at bedtime.     Marland Kitchen. SYNTHROID 100 MCG tablet Take 1 tablet (100 mcg total) by mouth daily before breakfast. 90 tablet 3  . amoxicillin (AMOXIL) 875 MG tablet Take 1 tablet (875 mg total) by mouth as needed. (Patient not taking: Reported on 02/24/2017) 20 tablet 1  . diazepam (VALIUM) 5 MG tablet Take 5 mg by mouth as needed for muscle spasms.      No current facility-administered medications for this visit.     Family History  Problem Relation Age of Onset  . Hypertension Mother   . Stroke Mother   . Thyroid disease Father        Hyper  . Osteoporosis Father   .  Stroke Father   . Diabetes Brother     ROS:  Pertinent items are noted in HPI.  Otherwise, a comprehensive ROS was negative.  Exam:   BP 122/78   Pulse 70   Resp 16   Ht 5' 4.5" (1.638 m)   Wt 126 lb (57.2 kg)   LMP 12/22/2014 (Exact Date)   BMI 21.29 kg/m  Height: 5' 4.5" (163.8 cm) Ht Readings from Last 3 Encounters:  02/24/17 5' 4.5" (1.638 m)  01/13/17 5\' 5"  (1.651 m)  06/24/16 5' 4.75" (1.645 m)    General appearance: alert, cooperative and appears stated age Head: Normocephalic, without obvious abnormality, atraumatic Neck: no adenopathy, supple, symmetrical, trachea midline and thyroid normal to inspection and palpation Lungs: clear to auscultation bilaterally Breasts: normal appearance, no masses or tenderness, No nipple retraction or dimpling, No nipple discharge or bleeding, No axillary or supraclavicular adenopathy Heart: regular rate and rhythm Abdomen: soft, non-tender; no masses,  no organomegaly Extremities: extremities normal, atraumatic, no cyanosis or edema Skin: Skin color, texture, turgor normal. No rashes or lesions Lymph nodes: Cervical, supraclavicular, and axillary nodes normal. No abnormal inguinal nodes palpated Neurologic: Grossly normal   Pelvic: External genitalia:  no lesions              Urethra:  normal appearing urethra with no masses, tenderness or lesions              Bartholin's and Skene's: normal                 Vagina: normal appearing vagina with normal color and discharge, no lesions              Cervix: multiparous appearance, no cervical motion tenderness and no lesions              Pap taken: No. Bimanual Exam:  Uterus:  normal size, contour, position, consistency, mobility, non-tender and anteverted              Adnexa: normal adnexa and no mass, fullness, tenderness               Rectovaginal: Confirms               Anus:  normal sphincter tone, no lesions  Chaperone present: yes  A:  Well Woman with normal  exam  Menopausal on HRT desires continuance, working well  Atrophic vaginitis with Vagifem use with good results, desires continuance  History of Vulvodynia with good management plan  History of Chronic Yeast  Hypothyroid with Endocrine management  MD management of Borderline hypertension, aex, labs  P:   Reviewed health and wellness pertinent to exam  Discussed risks/benefits/warning signs of HRT and need to advise if vaginal bleeding. Patient desires continuance.  Rx Estradiol 1 mg see order with instructions  Rx Provera 5 mg see order with instructions  Discussed risks/benefits of Vagifem and need to advise if vaginal bleeding or irritation. Desires continuation.  Vagifem see order with instructions  History of Vulvodynia with Amoxicillin use and Clindamycin. Rx update  Rx Amoxicillin 875 mg see order with instructions  Discussed limited use of Diflucan due resistance, patient aware and uses only it and Amoxicillin with Vulvodynia flare.  HSV 1 history uses Acyclovir with good results,   Rx Acyclovir 400 mg see order with instructions  Continue follow up with MD management as indicated  Pap smear: no  counseled on breast self exam, mammography screening, feminine hygiene, use and side effects of HRT, menopause, adequate intake of calcium and vitamin D, diet and exercise  return annually or prn  An After Visit Summary was printed and given to the patient.

## 2017-02-24 ENCOUNTER — Other Ambulatory Visit: Payer: Self-pay

## 2017-02-24 ENCOUNTER — Ambulatory Visit: Payer: Managed Care, Other (non HMO) | Admitting: Certified Nurse Midwife

## 2017-02-24 ENCOUNTER — Encounter: Payer: Self-pay | Admitting: Certified Nurse Midwife

## 2017-02-24 VITALS — BP 122/78 | HR 70 | Resp 16 | Ht 64.5 in | Wt 126.0 lb

## 2017-02-24 DIAGNOSIS — Z01419 Encounter for gynecological examination (general) (routine) without abnormal findings: Secondary | ICD-10-CM

## 2017-02-24 DIAGNOSIS — Z8639 Personal history of other endocrine, nutritional and metabolic disease: Secondary | ICD-10-CM | POA: Diagnosis not present

## 2017-02-24 DIAGNOSIS — N952 Postmenopausal atrophic vaginitis: Secondary | ICD-10-CM | POA: Diagnosis not present

## 2017-02-24 DIAGNOSIS — I10 Essential (primary) hypertension: Secondary | ICD-10-CM | POA: Insufficient documentation

## 2017-02-24 DIAGNOSIS — Z79890 Hormone replacement therapy (postmenopausal): Secondary | ICD-10-CM | POA: Diagnosis not present

## 2017-02-24 DIAGNOSIS — N951 Menopausal and female climacteric states: Secondary | ICD-10-CM

## 2017-02-24 DIAGNOSIS — Z8742 Personal history of other diseases of the female genital tract: Secondary | ICD-10-CM

## 2017-02-24 DIAGNOSIS — B009 Herpesviral infection, unspecified: Secondary | ICD-10-CM

## 2017-02-24 MED ORDER — MEDROXYPROGESTERONE ACETATE 5 MG PO TABS: 5.0000 mg | ORAL_TABLET | Freq: Every day | ORAL | 4 refills | Status: DC

## 2017-02-24 MED ORDER — ESTRADIOL 10 MCG VA TABS: ORAL_TABLET | VAGINAL | 4 refills | Status: DC

## 2017-02-24 MED ORDER — ESTRADIOL 1 MG PO TABS: 1.0000 mg | ORAL_TABLET | Freq: Every day | ORAL | 4 refills | Status: DC

## 2017-02-24 MED ORDER — ACYCLOVIR 400 MG PO TABS: 400.0000 mg | ORAL_TABLET | Freq: Two times a day (BID) | ORAL | 3 refills | Status: DC

## 2017-02-24 MED ORDER — AMOXICILLIN 875 MG PO TABS: 875.0000 mg | ORAL_TABLET | ORAL | 1 refills | Status: DC | PRN

## 2017-02-24 NOTE — Patient Instructions (Signed)

## 2017-04-27 ENCOUNTER — Other Ambulatory Visit: Payer: Self-pay | Admitting: Internal Medicine

## 2017-04-27 DIAGNOSIS — R5383 Other fatigue: Secondary | ICD-10-CM

## 2017-04-27 DIAGNOSIS — R002 Palpitations: Secondary | ICD-10-CM

## 2017-04-27 DIAGNOSIS — E890 Postprocedural hypothyroidism: Secondary | ICD-10-CM

## 2017-06-12 ENCOUNTER — Other Ambulatory Visit: Payer: Self-pay | Admitting: Internal Medicine

## 2017-06-21 ENCOUNTER — Encounter: Payer: Self-pay | Admitting: Obstetrics and Gynecology

## 2017-07-21 ENCOUNTER — Encounter: Payer: Self-pay | Admitting: Internal Medicine

## 2017-07-21 ENCOUNTER — Ambulatory Visit: Payer: Managed Care, Other (non HMO) | Admitting: Internal Medicine

## 2017-07-21 VITALS — BP 106/74 | HR 76 | Ht 65.55 in | Wt 129.4 lb

## 2017-07-21 DIAGNOSIS — E892 Postprocedural hypoparathyroidism: Secondary | ICD-10-CM | POA: Diagnosis not present

## 2017-07-21 DIAGNOSIS — E890 Postprocedural hypothyroidism: Secondary | ICD-10-CM

## 2017-07-21 LAB — T4, FREE: FREE T4: 0.97 ng/dL (ref 0.60–1.60)

## 2017-07-21 LAB — BASIC METABOLIC PANEL WITH GFR
BUN: 17 mg/dL (ref 7–25)
CALCIUM: 8.4 mg/dL — AB (ref 8.6–10.4)
CHLORIDE: 104 mmol/L (ref 98–110)
CO2: 28 mmol/L (ref 20–32)
Creat: 0.92 mg/dL (ref 0.50–1.05)
GFR, Est African American: 80 mL/min/{1.73_m2} (ref >=60)
GFR, Est Non African American: 69 mL/min/{1.73_m2} (ref >=60)
GLUCOSE: 81 mg/dL (ref 65–99)
POTASSIUM: 3.8 mmol/L (ref 3.5–5.3)
SODIUM: 140 mmol/L (ref 135–146)

## 2017-07-21 LAB — VITAMIN D 25 HYDROXY (VIT D DEFICIENCY, FRACTURES): VITD: 52.44 ng/mL (ref 30.00–100.00)

## 2017-07-21 LAB — TSH: TSH: 1.57 u[IU]/mL (ref 0.35–4.50)

## 2017-07-21 LAB — T3, FREE: T3 FREE: 2.9 pg/mL (ref 2.3–4.2)

## 2017-07-21 NOTE — Patient Instructions (Addendum)
Please stop at the lab.  Continue Synthroid 100 mcg + liothyronine 5 mcg daily.  Take the thyroid hormone every day, with water, at least 30 minutes before breakfast, separated by at least 4 hours from: - acid reflux medications - calcium - iron - multivitamins  Please come back for a follow-up appointment in 1 year and labs in 6 months.

## 2017-07-21 NOTE — Progress Notes (Addendum)
Patient ID: Tabitha Clark, female   DOB: [birth year], 57 y.o.   MRN: [id number]    HPI  Tabitha Clark is a 57 y.o.-year-old very pleasant female, initially referred by Ria Comment, FNP, returning for follow-up for postsurgical hypothyroidism and hypoparathyroidism, dx'ed in 1997 after thyroidectomy for a thyroid nodule (turned out to be benign).  Last visit 6 months ago.  Postsurgical hypothyroidism   She is on Synthroid d.a.w. 100 mcg + liothyronine generic 5 mcg daily.  She has tried generic levothyroxine and developed abdominal pain.  She also needs to have liothyronine, otherwise, she feels exhausted.  Pt takes the thyroid hh: - in am - fasting - at least 1h from b'fast - + Ca, MVI - with b'fast - daily - no PPIs - not on Biotin  Reviewed patient's TFTs: Lab Results  Component Value Date   TSH 0.90 01/13/2017   FREET4 1.11 01/13/2017  06/24/2016: TSH 0.853 04/11/2016: TSH 0.407 06/29/2015: TSH 0.03, free t4: 1.06, free t3 2.58 (LT4 dose decreased to 100 mcg daily) TPO Abs (03/07/2014): 37 (<34).  She had PVCs with decreasing levothyroxine dose (!).  She stopped OCPs in 2016 and had diet change (eliminated gluten and dairy) >> lost weight.  Pt denies: - feeling nodules in neck - hoarseness - dysphagia - choking - SOB with lying down  She has + FH of thyroid disorders in: father >> Htyr. No FH of thyroid cancer. No h/o radiation tx to head or neck.  No seaweed or kelp. No recent contrast studies. No herbal supplements. No Biotin use. No recent steroids use - allergic to steroid.   Postsurgical hypoparathyroidism.  She is on: - vitamin D + Ca (Caltrate): 400 units + 600 mg 1 in am and 1/2 at night - 3x a week - Multivitamin: 200 mg calcium + ? Vitamin D - calcitriol 0.25 mcg daily with breakfast - Magnesium 500 mg - HCTZ 25 mg in a.m. >> no dehydration, occasional dizziness (possibly from Mnire's disease, reportedly). She had a slightly low K (3.4) >>  started K  - 10 mEq daily  Last calcium level was only slightly low, which is actually at goal for postsurgical hypoparathyroidism: Lab Results  Component Value Date   CALCIUM 8.3 (L) 01/13/2017  04/01/2016: Ca 9.0 06/29/2015: Ca 10.5, PTH <1  Magnesium and phosphorus levels were normal: Lab Results  Component Value Date   MG 1.8 01/13/2017   PHOS 3.6 01/13/2017   Vitamin D level was normal at last check: Lab Results  Component Value Date   VD25OH 55.53 01/13/2017    Reviewed 24h Urinary Calcium/Cr:  Latest check-all levels normal: Component     Latest Ref Rng & Units 02/10/2017  Calcium, 24H Urine     mg/24 h 235  Creatinine, 24H Ur     0.50 - 2.15 g/24 h 1.34  08/14/2013: 137 06/11/2012: 132  She had few instances of high calcium levels >> she has acid reflux symptoms then.  She may get hypoglycemic symptoms if she gets dehydrated.  Since last visit, she changed Lyrica to prn.   ROS: Constitutional: no weight gain/no weight loss, no fatigue, no subjective hyperthermia, no subjective hypothermia Eyes: no blurry vision, no xerophthalmia ENT: no sore throat, + see HPI Cardiovascular: no CP/no SOB/no palpitations/no leg swelling Respiratory: no cough/no SOB/no wheezing Gastrointestinal: no N/no V/no D/no C/no acid reflux Musculoskeletal: no muscle aches/no joint aches Skin: no rashes, no hair loss Neurological: no tremors/no numbness/no tingling/no dizziness  I reviewed  pt's medications, allergies, PMH, social hx, family hx, and changes were documented in the history of present illness. Otherwise, unchanged from my initial visit note.  Past Medical History:  Diagnosis Date  . Abnormal Pap smear of cervix   . Chronic back pain   . Chronic vulvitis and desquamative vaginitis    bx benign hyperplasia  . HSV-1 infection   . Hypocalcemia   . IBS (irritable bowel syndrome)   . PVC (premature ventricular contraction) 2004  . Thyroid disease    Hypothyroidism,  Hypoparathyroidism  . Yeast infection    Chronic   Past Surgical History:  Procedure Laterality Date  . BASAL CELL CARCINOMA EXCISION Left 05/2013  . BREAST ENHANCEMENT SURGERY  07/2007   Saline  . COLPOSCOPY     28 yrs ago  . DIAGNOSTIC LAPAROSCOPY  1988  . HNP Re-Repair  1/10  2009  . thumb surgery Left 1989  . THYROIDECTOMY  10/1995   due to thyroid nodule  L4-L5 laser Sx 2009 and 2010 L radial nerve repair 1989  Social History   Social History  . Marital status: Married    Spouse name: N/A  . Number of children: 2   Occupational History  . 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 Smoking status: Never Smoker  . Smokeless tobacco: Never Used  . Alcohol use          Comment: 1-2 glasses of wine every mo  . Drug use: No  . 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 acyclovir (ZOVIRAX) 400 MG tablet Take 1 tablet (400 mg total) by mouth 2 (two) times daily. 180 tablet 3  . amoxicillin (AMOXIL) 875 MG tablet Take 1 tablet (875 mg total) by mouth as needed. 20 tablet 1  . aspirin EC 81 MG tablet Take 81 mg by mouth daily.    Marland Kitchen. BORIC ACID EX Apply topically as needed.    . calcitRIOL (ROCALTROL) 0.25 MCG capsule TAKE 1 CAPSULE DAILY 90 capsule 3  . CALCIUM PO Take 600 mg by mouth 2 (two) times daily.     . clindamycin (CLEOCIN) 2 % vaginal cream USE AT BEDTIME FOR 5 DAYS AS NEEDED. 40 g 4  . cyclobenzaprine (FLEXERIL) 5 MG tablet Take 5 mg by mouth as needed.    . desonide (DESOWEN) 0.05 % ointment Apply topically as needed. 60 g 1  . diazepam (VALIUM) 5 MG tablet Take 5 mg by mouth as needed for muscle spasms.     Marland Kitchen. estradiol (ESTRACE) 1 MG tablet Take 1 tablet (1 mg total) by mouth daily. 90 tablet 4  . Estradiol 10  MCG TABS vaginal tablet One tablet vaginally twice weekly 36 tablet 4  . fluconazole (DIFLUCAN) 150 MG tablet Take 150 mg by mouth as needed.    . hydrochlorothiazide (HYDRODIURIL) 25 MG tablet Take 1 tablet (25 mg total) by mouth daily. 90 tablet 3  . liothyronine (CYTOMEL) 5 MCG tablet Take 1 tablet (5 mcg total) by mouth daily. 90 tablet 3  . Magnesium 500 MG CAPS Take by mouth.    . medroxyPROGESTERone (PROVERA) 5 MG tablet Take 1 tablet (5 mg total) by mouth daily. 90 tablet 4  . Multiple Vitamins-Minerals (MULTIVITAMIN  PO) Take by mouth.    . Omega-3 Fatty Acids (FISH OIL PO) Take by mouth.    . pregabalin (LYRICA) 50 MG capsule Take 50 mg by mouth at bedtime.     Marland Kitchen SYNTHROID 100 MCG tablet Take 1 tablet (100 mcg total) by mouth daily before breakfast. 90 tablet 3   No current facility-administered medications on file prior to visit.    Allergies  Allergen Reactions  . Famotidine     Affects calcium  . Macrobid [Nitrofurantoin] Nausea Only  . Nsaids   . Sulfa Antibiotics Nausea Only  . Prednisone Rash   Family History  Problem Relation Age of Onset  . Hypertension Mother   . Stroke Mother   . Thyroid disease Father        Hyper  . Osteoporosis Father   . Stroke Father   . Diabetes Brother     PE: BP 106/74   Pulse 76   Ht 5' 5.55" (1.665 m)   Wt 129 lb 6.4 oz (58.7 kg)   LMP 12/22/2014 (Exact Date)   SpO2 97%   BMI 21.17 kg/m  Wt Readings from Last 3 Encounters:  07/21/17 129 lb 6.4 oz (58.7 kg)  02/24/17 126 lb (57.2 kg)  01/13/17 127 lb 6.4 oz (57.8 kg)   Constitutional: normal weight, in NAD Eyes: PERRLA, EOMI, no exophthalmos ENT: moist mucous membranes, no thyromegaly, no cervical lymphadenopathy Cardiovascular: RRR, No MRG Respiratory: CTA B Gastrointestinal: abdomen soft, NT, ND, BS+ Musculoskeletal: no deformities, strength intact in all 4 Skin: moist, warm, no rashes Neurological: no tremor with outstretched hands, DTR normal in all  4  ASSESSMENT: 1. Postsurgical hypothyroidism  2. Postsurgical hypoparathyroidism  PLAN:  1. Patient with long-standing, postsurgical hypothyroidism, developed after her thyroidectomy 1997 (benign pathology).  She is on a mixed regimen of Synthroid d.a.w. and liothyronine generic.  The equivalent total dose of thyroid hormones is 120 mcg levothyroxine.  She has tried to come off liothyronine  in the past but she developed severe fatigue.  She also tried generic levothyroxine and developed abdominal pain. - latest thyroid labs reviewed with pt >> normal  - she continues on Synthroid 100 mcg + LT3 5 mcg daily - pt feels good on this dose. - we discussed about taking the thyroid hormone every day, with water, >30 minutes before breakfast, separated by >4 hours from acid reflux medications, calcium, iron, multivitamins. Pt. is taking it correctly. - will check thyroid tests today: TSH, fT3 and fT4 - If labs are abnormal, she will need to return for repeat TFTs in 1.5 months  2. Postsurgical hypoparathyroidism - Well-controlled on her current regimen (please see HPI), no need for Natpara - We reviewed together her most recent calcium levels (slightly low) and also her most recent 24-hour urine calcium.  This was normal. - She denies perioral numbness and hand cramping-  - We will continue her current regimen for now.  We will check a calcium and vitamin D level today  Needs refills for HCTZ, LT4, LT3, Calcitriol.  Component     Latest Ref Rng & Units 07/21/2017  Glucose     65 - 99 mg/dL 81  BUN     7 - 25 mg/dL 17  Creatinine     3.55 - 1.05 mg/dL 7.32  GFR, Est Non African American     > OR = 60 mL/min/1.64m2 69  GFR, Est African American     > OR = 60 mL/min/1.57m2 80  BUN/Creatinine Ratio  6 - 22 (calc) NOT APPLICABLE  Sodium     135 - 146 mmol/L 140  Potassium     3.5 - 5.3 mmol/L 3.8  Chloride     98 - 110 mmol/L 104  CO2     20 - 32 mmol/L 28  Calcium     8.6 - 10.4  mg/dL 8.4 (L)  VITD     16.10 - 100.00 ng/mL 52.44  TSH     0.35 - 4.50 uIU/mL 1.57  T4,Free(Direct)     0.60 - 1.60 ng/dL 9.60  Triiodothyronine,Free,Serum     2.3 - 4.2 pg/mL 2.9   Labs are normal, except slightly low Ca.   Carlus Pavlov, MD PhD [facility]

## 2017-07-24 MED ORDER — HYDROCHLOROTHIAZIDE 25 MG PO TABS: 25.0000 mg | ORAL_TABLET | Freq: Every day | ORAL | 3 refills | Status: DC

## 2017-07-24 MED ORDER — LIOTHYRONINE SODIUM 5 MCG PO TABS: 5.0000 ug | ORAL_TABLET | Freq: Every day | ORAL | 3 refills | Status: DC

## 2017-07-24 MED ORDER — SYNTHROID 100 MCG PO TABS: 100.0000 ug | ORAL_TABLET | Freq: Every day | ORAL | 3 refills | Status: DC

## 2017-07-24 MED ORDER — CALCITRIOL 0.25 MCG PO CAPS: 0.2500 ug | ORAL_CAPSULE | Freq: Every day | ORAL | 3 refills | Status: DC

## 2017-08-24 ENCOUNTER — Encounter: Payer: Self-pay | Admitting: Obstetrics and Gynecology

## 2018-01-12 ENCOUNTER — Encounter: Payer: Self-pay | Admitting: Internal Medicine

## 2018-01-12 ENCOUNTER — Other Ambulatory Visit: Payer: Self-pay | Admitting: Internal Medicine

## 2018-01-12 ENCOUNTER — Other Ambulatory Visit (INDEPENDENT_AMBULATORY_CARE_PROVIDER_SITE_OTHER): Payer: Managed Care, Other (non HMO)

## 2018-01-12 DIAGNOSIS — E890 Postprocedural hypothyroidism: Secondary | ICD-10-CM

## 2018-01-12 DIAGNOSIS — E892 Postprocedural hypoparathyroidism: Secondary | ICD-10-CM

## 2018-01-12 DIAGNOSIS — R5383 Other fatigue: Secondary | ICD-10-CM

## 2018-01-12 DIAGNOSIS — R002 Palpitations: Secondary | ICD-10-CM

## 2018-01-12 NOTE — Addendum Note (Signed)
Addended by: STONE-ELMORE, Dorris Pierre I on: 01/12/2018 09:00 AM   Modules accepted: Orders  

## 2018-01-12 NOTE — Addendum Note (Signed)
Addended by: Adline MangoSTONE-ELMORE, Teja Costen I on: 01/12/2018 09:00 AM   Modules accepted: Orders

## 2018-01-12 NOTE — Addendum Note (Signed)
Addended by: STONE-ELMORE, Letonya Mangels I on: 01/12/2018 09:00 AM   Modules accepted: Orders  

## 2018-01-13 LAB — T3, FREE: T3 FREE: 3.7 pg/mL (ref 2.0–4.4)

## 2018-01-13 LAB — SPECIMEN STATUS REPORT

## 2018-01-13 LAB — T4, FREE: FREE T4: 1.58 ng/dL (ref 0.82–1.77)

## 2018-01-13 LAB — CALCIUM, IONIZED: Calcium, Ion: 5.1 mg/dL (ref 4.8–5.6)

## 2018-01-13 LAB — TSH: TSH: 1.08 u[IU]/mL (ref 0.450–4.500)

## 2018-02-09 ENCOUNTER — Other Ambulatory Visit: Payer: Self-pay | Admitting: Certified Nurse Midwife

## 2018-02-09 DIAGNOSIS — N952 Postmenopausal atrophic vaginitis: Secondary | ICD-10-CM

## 2018-02-09 DIAGNOSIS — N951 Menopausal and female climacteric states: Secondary | ICD-10-CM

## 2018-02-09 DIAGNOSIS — Z79890 Hormone replacement therapy (postmenopausal): Principal | ICD-10-CM

## 2018-02-09 NOTE — Telephone Encounter (Signed)
Medication refill request: estrace  Last AEX:   02/24/17 Next AEX: 03/09/18 Last MMG (if hormonal medication request): 08/24/17  Category 2 benign  Refill authorized: #30 with 0 RF to get her to her aex     Medication refill request: Provera Last AEX:   02/24/17 Next AEX: 03/09/18 Last MMG (if hormonal medication request): 08/24/17  Category 2 benign  Refill authorized: #30 with 0 RF to get her to her aex

## 2018-03-09 ENCOUNTER — Ambulatory Visit: Payer: Managed Care, Other (non HMO) | Admitting: Obstetrics and Gynecology

## 2018-03-09 ENCOUNTER — Encounter: Payer: Self-pay | Admitting: Obstetrics and Gynecology

## 2018-03-09 ENCOUNTER — Other Ambulatory Visit: Payer: Self-pay

## 2018-03-09 VITALS — BP 120/78 | HR 84 | Ht 64.0 in | Wt 127.8 lb

## 2018-03-09 DIAGNOSIS — Z79890 Hormone replacement therapy (postmenopausal): Secondary | ICD-10-CM

## 2018-03-09 DIAGNOSIS — N951 Menopausal and female climacteric states: Secondary | ICD-10-CM

## 2018-03-09 DIAGNOSIS — Z01419 Encounter for gynecological examination (general) (routine) without abnormal findings: Secondary | ICD-10-CM

## 2018-03-09 DIAGNOSIS — B009 Herpesviral infection, unspecified: Secondary | ICD-10-CM

## 2018-03-09 DIAGNOSIS — N952 Postmenopausal atrophic vaginitis: Secondary | ICD-10-CM | POA: Diagnosis not present

## 2018-03-09 MED ORDER — ESTRADIOL 1 MG PO TABS: 1.0000 mg | ORAL_TABLET | Freq: Every day | ORAL | 3 refills | Status: DC

## 2018-03-09 MED ORDER — FLUCONAZOLE 150 MG PO TABS: 150.0000 mg | ORAL_TABLET | ORAL | 1 refills | Status: DC | PRN

## 2018-03-09 MED ORDER — CLINDAMYCIN PHOSPHATE 2 % VA CREA: TOPICAL_CREAM | VAGINAL | 3 refills | Status: DC

## 2018-03-09 MED ORDER — MEDROXYPROGESTERONE ACETATE 5 MG PO TABS: 5.0000 mg | ORAL_TABLET | Freq: Every day | ORAL | 3 refills | Status: DC

## 2018-03-09 MED ORDER — ESTRADIOL 10 MCG VA TABS: ORAL_TABLET | VAGINAL | 3 refills | Status: DC

## 2018-03-09 MED ORDER — ACYCLOVIR 400 MG PO TABS: 400.0000 mg | ORAL_TABLET | Freq: Two times a day (BID) | ORAL | 3 refills | Status: DC

## 2018-03-09 NOTE — Progress Notes (Signed)
58 y.o. G56P2002 Married Caucasian female here for annual exam.    Has occasional hot flashes, mostly at night.  Taking HRT and using Vagifem.  No vaginal bleeding.  She wants to continue for another year.   Hx of vulvodynia.  She states that Amoxicillin and Clindamycin treat this and her desquamative vaginitis.  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  Less yeast infections recently.  Wants a refill of the Clindamycin cream.  She has a refill of Amoxicillin at home.  Wants to have a prescription for Diflucan to have on hand.  She does her own swabs at work for confirmation of yeast.  She is using every 3 months, worse during the summer.   Uses Desenoid after intercourse to help reduce inflammation and pain.   Has current URI.   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  2 children and 1 grandchild.  Building a house.  Labs with PCP and endocrinology.   PCP: Feliciana Rossetti, MD   Endocrinology:  Shella Maxim, MD  Patient's last menstrual period was 12/22/2014 (exact date).           Sexually active: Yes.   female The current method of family planning is post menopausal status.    Exercising: No.  The patient does not participate in regular exercise at present. Smoker:  no  Health Maintenance: Pap: 02-05-16 Neg:Neg HR HPV, 12-07-12 Neg:Neg HR HPV History of abnormal Pap:  Yes, 30 years ago of colposcopy but no treatment. MMG: 08-24-17 3D Diag.Bil/Neg/density c/BiRads2--Rt.Br.US Neg/screening 55yr/BiRads1 Colonoscopy: 2013 normal --didn't see complete colon.  Dr. Madilyn Fireman.  BMD: 06-01-12  Result :Normal TDaP: up to date through work Gardasil:   no HIV: 01-30-15 NR Hep C: 01-30-15 Neg Screening Labs:  Hb today: PCP    reports that she has never smoked. She has never used smokeless tobacco. She reports current alcohol use of about 2.0 standard drinks of alcohol per week. She reports that  she does not use drugs.  Past Medical History:  Diagnosis Date  . Abnormal Pap smear of cervix    Hx of colposcopy but no treatment--paps normal since  . Chronic back pain   . Chronic vulvitis and desquamative vaginitis    bx benign hyperplasia  . HSV-1 infection   . Hypocalcemia   . IBS (irritable bowel syndrome)   . PVC (premature ventricular contraction) 2004  . Thyroid disease    Hypothyroidism, Hypoparathyroidism  . Yeast infection    Chronic    Past Surgical History:  Procedure Laterality Date  . BASAL CELL CARCINOMA EXCISION Left 05/2013  . BREAST ENHANCEMENT SURGERY  07/2007   Saline  . COLPOSCOPY     28 yrs ago  . DIAGNOSTIC LAPAROSCOPY  1988  . HNP Re-Repair  1/10  2009  . thumb surgery Left 1989  . THYROIDECTOMY  10/1995   due to thyroid nodule    Current Outpatient Medications  Medication Sig Dispense Refill  . acyclovir (ZOVIRAX) 400 MG tablet Take 1 tablet (400 mg total) by mouth 2 (two) times daily. 180 tablet 3  . amoxicillin (AMOXIL) 875 MG tablet Take 1 tablet (875 mg total) by mouth as needed. 20 tablet 1  . aspirin EC 81 MG tablet Take 81 mg by mouth daily.    Marland Kitchen BORIC ACID EX Apply topically as needed.    . calcitRIOL (ROCALTROL) 0.25 MCG capsule Take 1  capsule (0.25 mcg total) by mouth daily. 90 capsule 3  . calcium carbonate (OSCAL) 1500 (600 Ca) MG TABS tablet Take 600 mg of elemental calcium by mouth 2 (two) times daily with a meal.    . CALCIUM PO Take 600 mg by mouth 2 (two) times daily.     . clindamycin (CLEOCIN) 2 % vaginal cream USE AT BEDTIME FOR 5 DAYS AS NEEDED. 40 g 4  . cyclobenzaprine (FLEXERIL) 5 MG tablet Take 5 mg by mouth as needed.    . desonide (DESOWEN) 0.05 % ointment Apply topically as needed. 60 g 1  . diazepam (VALIUM) 5 MG tablet Take 5 mg by mouth as needed for muscle spasms.     Marland Kitchen estradiol (ESTRACE) 1 MG tablet TAKE 1 TABLET DAILY 30 tablet 0  . Estradiol 10 MCG TABS vaginal tablet One tablet vaginally twice weekly 36  tablet 4  . fluconazole (DIFLUCAN) 150 MG tablet Take 150 mg by mouth as needed.    . hydrochlorothiazide (HYDRODIURIL) 25 MG tablet Take 1 tablet (25 mg total) by mouth daily. 90 tablet 3  . liothyronine (CYTOMEL) 5 MCG tablet Take 1 tablet (5 mcg total) by mouth daily. 90 tablet 3  . Magnesium 500 MG CAPS Take by mouth.    . medroxyPROGESTERone (PROVERA) 5 MG tablet TAKE 1 TABLET DAILY 30 tablet 0  . Multiple Vitamins-Minerals (MULTIVITAMIN PO) Take by mouth.    . Omega-3 Fatty Acids (FISH OIL PO) Take by mouth.    . potassium chloride (K-DUR) 10 MEQ tablet Take 10 mEq by mouth daily.  3  . pregabalin (LYRICA) 50 MG capsule Take 50 mg by mouth at bedtime.     Marland Kitchen SYNTHROID 100 MCG tablet Take 1 tablet (100 mcg total) by mouth daily before breakfast. 90 tablet 3  . fexofenadine (ALLEGRA) 60 MG tablet Take 1 tablet by mouth daily.     No current facility-administered medications for this visit.     Family History  Problem Relation Age of Onset  . Hypertension Mother   . Stroke Mother   . Thyroid disease Father        Hyper  . Osteoporosis Father   . Stroke Father        x2  . Diabetes Brother     Review of Systems  HENT:       Sinusitis  All other systems reviewed and are negative.   Exam:   BP 120/78 (BP Location: Right Arm, Patient Position: Sitting, Cuff Size: Normal)   Pulse 84   Ht 5\' 4"  (1.626 m)   Wt 127 lb 12.8 oz (58 kg)   LMP 12/22/2014 (Exact Date)   BMI 21.94 kg/m     General appearance: alert, cooperative and appears stated age Head: Normocephalic, without obvious abnormality, atraumatic Neck: no adenopathy, supple, symmetrical, trachea midline and thyroid normal to inspection and palpation Lungs: clear to auscultation bilaterally Breasts: bilateral implants, no masses or tenderness, No nipple retraction or dimpling, No nipple discharge or bleeding, No axillary or supraclavicular adenopathy Heart: regular rate and rhythm Abdomen: soft, non-tender; no masses,  no organomegaly Extremities: extremities normal, atraumatic, no cyanosis or edema Skin: Skin color, texture, turgor normal. No rashes or lesions Lymph nodes: Cervical, supraclavicular, and axillary nodes normal. No abnormal inguinal nodes palpated Neurologic: Grossly normal  Pelvic: External genitalia:  no lesions              Urethra:  normal appearing urethra with no masses, tenderness or lesions  Bartholins and Skenes: normal                 Vagina: normal appearing vagina with normal color and discharge, no lesions              Cervix: no lesions              Pap taken: No. Bimanual Exam:  Uterus:  normal size, contour, position, consistency, mobility, non-tender              Adnexa: no mass, fullness, tenderness              Rectal exam: Yes.  .  Confirms.              Anus:  normal sphincter tone, no lesions (She reported vaginal itching after having her exam done with a nitrile glove with Aloe and vit E in glove.)  Chaperone was present for exam.  Assessment:   Well woman visit with normal exam. Bilateral breast implants. HRT.  Hx vulvodynia, desquamative vaginitis, and chronic yeast infection.  Hx HSV 1.  Hypothyroidism.  Hypoparathyroidism.   Plan: Mammogram screening.  She will do 3D.  Recommended self breast awareness. Pap and HR HPV as above. Guidelines for Calcium, Vitamin D, regular exercise program including cardiovascular and weight bearing exercise. Declines colon cancer screening.  Refill of HRT and Vagifem for one year.  We reviewed WHI and potential increased risks of stroke, MI, PE, DVT, and breast cancer.  Refill of Diflucan to have on hand.  Refill of Clindamycin vaginal cream.  (Using protocol which is not in Up to Date.) Refill of Acyclovir for prophylaxis.  No refill of Amoxicillin given. If her desquamative vaginitis symptoms do not respond to Clindamycin, would consider intravaginal steroids.  Follow up annually and prn.   After  visit summary provided.

## 2018-03-09 NOTE — Patient Instructions (Signed)

## 2018-07-18 ENCOUNTER — Other Ambulatory Visit: Payer: Self-pay

## 2018-07-20 ENCOUNTER — Other Ambulatory Visit: Payer: Self-pay

## 2018-07-20 ENCOUNTER — Ambulatory Visit: Payer: Managed Care, Other (non HMO) | Admitting: Internal Medicine

## 2018-07-20 ENCOUNTER — Encounter: Payer: Self-pay | Admitting: Internal Medicine

## 2018-07-20 VITALS — BP 110/60 | HR 76 | Ht 64.75 in | Wt 128.0 lb

## 2018-07-20 DIAGNOSIS — E892 Postprocedural hypoparathyroidism: Secondary | ICD-10-CM

## 2018-07-20 DIAGNOSIS — E890 Postprocedural hypothyroidism: Secondary | ICD-10-CM

## 2018-07-20 LAB — T3, FREE: T3, Free: 3 pg/mL (ref 2.3–4.2)

## 2018-07-20 LAB — T4, FREE: Free T4: 0.97 ng/dL (ref 0.60–1.60)

## 2018-07-20 LAB — TSH: TSH: 1 u[IU]/mL (ref 0.35–4.50)

## 2018-07-20 NOTE — Patient Instructions (Signed)
Please stop at the lab.  Continue Synthroid 100 mcg + liothyronine 5 mcg daily.  Take the thyroid hormone every day, with water, at least 30 minutes before breakfast, separated by at least 4 hours from: - acid reflux medications - calcium - iron - multivitamins  Please come back for a follow-up appointment in 1 year. 

## 2018-07-20 NOTE — Progress Notes (Signed)
Patient ID: Tabitha Clark, female   DOB: [birth year], 58 y.o.   MRN: [id number]    HPI  Tabitha LikesRebecca C Clark is a 58 y.o.-year-old very pleasant female, initially referred by Ria CommentPatricia Grubb, FNP, returning for follow-up for postsurgical hypothyroidism and hypoparathyroidism, dx'ed in 1997 after thyroidectomy for a thyroid nodule (turned out to be benign).  Last visit 6 months ago.  Postsurgical hypothyroidism   She is on Synthroid d.a.w. 100 mcg + liothyronine generic 5 mcg daily.  She has tried generic levothyroxine and developed abdominal pain.  She also needs to have liothyronine, otherwise, she feels exhausted.  She takes the thyroid hormones: - in am - fasting - at least 1 hour from b'fast - + Ca, MVI 1-1.5 h - no Fe, PPIs - not on Biotin  Reviewed patient's TFTs: Lab Results  Component Value Date   TSH 1.080 01/12/2018   TSH 1.57 07/21/2017   TSH 0.90 01/13/2017   FREET4 1.58 01/12/2018   FREET4 0.97 07/21/2017   FREET4 1.11 01/13/2017  06/24/2016: TSH 0.853 04/11/2016: TSH 0.407 06/29/2015: TSH 0.03, free t4: 1.06, free t3 2.58 (LT4 dose decreased to 100 mcg daily) TPO Abs (03/07/2014): 37 (<34).  She had PVCs when she tried to decrease her levothyroxine dose (!).  She stopped OCPs in 2016 and had diet change (eliminated gluten and dairy) >> lost weight.  Pt denies: - feeling nodules in neck - hoarseness - dysphagia - choking - SOB with lying down  She has + FH of thyroid disorders in: father >> Htyr. No FH of thyroid cancer. No h/o radiation tx to head or neck.  No seaweed or kelp. No recent contrast studies. No herbal supplements. No Biotin use. No recent steroids use (she is allergic to steroids).   Postsurgical hypoparathyroidism.  She continues on: - vitamin D + Ca (Caltrate): 400 units + 600 mg 2 times a week - Multivitamin: 200 mg calcium + ? Vitamin D - calcitriol 0.25 mcg daily with breakfast - Magnesium 500 mg daily - HCTZ 25  mg in a.m. >> no  dehydration, occasional dizziness (possibly from Mnire's disease, reportedly).  She had slightly low calcium levels in the past, which is actually at goal for postsurgical hypocalcemia.  However, at last check, ionized calcium was normal: Component     Latest Ref Rng & Units 01/12/2018  Calcium Ionized     4.8 - 5.6 mg/dL 5.1   Lab Results  Component Value Date   CALCIUM 8.4 (L) 07/21/2017  04/01/2016: Ca 9.0 06/29/2015: Ca 10.5, PTH <1  Magnesium and phosphorus levels were normal: Lab Results  Component Value Date   MG 1.8 01/13/2017   PHOS 3.6 01/13/2017   Her vitamin D levels were normal: Lab Results  Component Value Date   VD25OH 52.44 07/21/2017   VD25OH 55.53 01/13/2017    Reviewed 24-hour urine calcium and creatinine: Normal: Component     Latest Ref Rng & Units 02/10/2017  Calcium, 24H Urine     mg/24 h 235  Creatinine, 24H Ur     0.50 - 2.15 g/24 h 1.34  08/14/2013: 137 06/11/2012: 132  She had few instances of high calcium levels >> she has acid reflux symptoms then.  She may get hypoglycemic symptoms if she gets dehydrated.  She is also on potassium 10 mEq daily.   ROS: Constitutional: no weight gain/no weight loss, no fatigue, no subjective hyperthermia, no subjective hypothermia Eyes: no blurry vision, no xerophthalmia ENT: no sore throat, + see HPI  Cardiovascular: no CP/no SOB/no palpitations/no leg swelling Respiratory: no cough/no SOB/no wheezing Gastrointestinal: no N/no V/no D/no C/no acid reflux Musculoskeletal: no muscle aches/no joint aches Skin: no rashes, no hair loss Neurological: no tremors/no numbness/no tingling/no dizziness  I reviewed pt's medications, allergies, PMH, social hx, family hx, and changes were documented in the history of present illness. Otherwise, unchanged from my initial visit note.  Past Medical History:  Diagnosis Date  . Abnormal Pap smear of cervix    Hx of colposcopy but no treatment--paps normal since  .  Chronic back pain   . Chronic vulvitis and desquamative vaginitis    bx benign hyperplasia  . HSV-1 infection   . Hypocalcemia   . IBS (irritable bowel syndrome)   . PVC (premature ventricular contraction) 2004  . Thyroid disease    Hypothyroidism, Hypoparathyroidism  . Yeast infection    Chronic   Past Surgical History:  Procedure Laterality Date  . BASAL CELL CARCINOMA EXCISION Left 05/2013  . BREAST ENHANCEMENT SURGERY  07/2007   Saline  . COLPOSCOPY     28 yrs ago  . DIAGNOSTIC LAPAROSCOPY  1988  . HNP Re-Repair  1/10  2009  . thumb surgery Left 1989  . THYROIDECTOMY  10/1995   due to thyroid nodule  L4-L5 laser Sx 2009 and 2010 L radial nerve repair 1989  Social History   Social History  . Marital status: Married    Spouse name: N/A  . Number of children: 2   Occupational History  . 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 Smoking status: Never Smoker  . Smokeless tobacco: Never Used  . Alcohol use          Comment: 1-2 glasses of wine every mo  . Drug use: No  . 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 acyclovir (ZOVIRAX) 400 MG tablet Take 1 tablet (400 mg total) by mouth 2 (two) times daily. 180 tablet 3  . amoxicillin (AMOXIL) 875 MG tablet Take 1 tablet (875 mg total) by mouth as needed. 20 tablet 1  . aspirin EC 81 MG tablet Take 81 mg by mouth daily.    Marland Kitchen. BORIC ACID EX Apply topically as needed.    . calcitRIOL (ROCALTROL) 0.25 MCG capsule Take 1 capsule (0.25 mcg total) by mouth daily. 90 capsule 3  . calcium carbonate (OSCAL) 1500 (600 Ca) MG TABS tablet Take 600 mg of elemental calcium by mouth 2 (two) times daily with a meal.    . CALCIUM PO Take 600 mg by mouth 2 (two) times daily.     .  clindamycin (CLEOCIN) 2 % vaginal cream USE AT BEDTIME FOR 5 DAYS AS NEEDED. 40 g 3  . cyclobenzaprine (FLEXERIL) 5 MG tablet Take 5 mg by mouth as needed.    . desonide (DESOWEN) 0.05 % ointment Apply topically as needed. 60 g 1  . diazepam (VALIUM) 5 MG tablet Take 5 mg by mouth as needed for muscle spasms.     Marland Kitchen. estradiol (ESTRACE) 1 MG tablet Take 1 tablet (1 mg total) by mouth daily. 90 tablet 3  . Estradiol 10 MCG TABS vaginal tablet One tablet vaginally twice weekly 24 tablet 3  . fexofenadine (ALLEGRA) 60  MG tablet Take 1 tablet by mouth daily.    . fluconazole (DIFLUCAN) 150 MG tablet Take 1 tablet (150 mg total) by mouth as needed. May repeat second dosage in 72 hours if needed. 12 tablet 1  . hydrochlorothiazide (HYDRODIURIL) 25 MG tablet Take 1 tablet (25 mg total) by mouth daily. 90 tablet 3  . liothyronine (CYTOMEL) 5 MCG tablet Take 1 tablet (5 mcg total) by mouth daily. 90 tablet 3  . Magnesium 500 MG CAPS Take by mouth.    . medroxyPROGESTERone (PROVERA) 5 MG tablet Take 1 tablet (5 mg total) by mouth daily. 90 tablet 3  . Multiple Vitamins-Minerals (MULTIVITAMIN PO) Take by mouth.    . Omega-3 Fatty Acids (FISH OIL PO) Take by mouth.    . potassium chloride (K-DUR) 10 MEQ tablet Take 10 mEq by mouth daily.  3  . pregabalin (LYRICA) 50 MG capsule Take 50 mg by mouth at bedtime.     Marland Kitchen SYNTHROID 100 MCG tablet Take 1 tablet (100 mcg total) by mouth daily before breakfast. 90 tablet 3   No current facility-administered medications on file prior to visit.    Allergies  Allergen Reactions  . Famotidine     Affects calcium  . Macrobid [Nitrofurantoin] Nausea Only  . Nsaids   . Sulfa Antibiotics Nausea Only  . Prednisone Rash   Family History  Problem Relation Age of Onset  . Hypertension Mother   . Stroke Mother   . Thyroid disease Father        Hyper  . Osteoporosis Father   . Stroke Father        x2  . Diabetes Brother     PE: BP 110/60   Pulse 76   Ht 5'  4.75" (1.645 m) Comment: measured without shoes  Wt 128 lb (58.1 kg)   LMP 12/22/2014 (Exact Date)   BMI 21.47 kg/m  Wt Readings from Last 3 Encounters:  07/20/18 128 lb (58.1 kg)  03/09/18 127 lb 12.8 oz (58 kg)  07/21/17 129 lb 6.4 oz (58.7 kg)   Constitutional: normal weight, in NAD Eyes: PERRLA, EOMI, no exophthalmos ENT: moist mucous membranes, no thyromegaly, no cervical lymphadenopathy Cardiovascular: RRR, No MRG Respiratory: CTA B Gastrointestinal: abdomen soft, NT, ND, BS+ Musculoskeletal: no deformities, strength intact in all 4 Skin: moist, warm, no rashes Neurological: no tremor with outstretched hands, DTR normal in all 4  ASSESSMENT: 1. Postsurgical hypothyroidism  2. Postsurgical hypoparathyroidism  PLAN:  1. Patient with longstanding, postsurgical, hypothyroidism, developed after her thyroidectomy in 1997 (benign pathology).  She is on a mixed regimen of Synthroid d.a.w. and liothyronine generic.  The equivalent total dose of thyroid hormones = 120 mcg levothyroxine.  She has tried to come off liothyronine in the past but developed severe fatigue.  She also tried generic levothyroxine but developed abdominal pain. - latest thyroid labs reviewed with pt >> normal  - she continues on Synthroid 100 mcg + liothyronine 5 mcg daily - pt feels good on this dose. - we discussed about taking the thyroid hormone every day, with water, >30 minutes before breakfast, separated by >4 hours from acid reflux medications, calcium, iron, multivitamins. Pt. is taking it correctly. - will check thyroid tests today: TSH, free T3, and fT4 - If labs are abnormal, she will need to return for repeat TFTs in 1.5 months  2. Postsurgical hypoparathyroidism -Controlled on the current regimen (please see HPI) -Most recent calcium level was normal (ionized calcium) in 12/2017.  Previous  24-hour urine calcium was normal. -She denies perioral numbness and hand cramping - she has mm cramps - from  low potassium - on supplementation -We will continue her current regimen for now.  We will check a ionized calcium and vitamin D level today.  Needs refills for HCTZ, LT4, LT3, Calcitriol.  Component     Latest Ref Rng & Units 07/20/2018  TSH     0.35 - 4.50 uIU/mL 1.00  T4,Free(Direct)     0.60 - 1.60 ng/dL 4.090.97  Triiodothyronine,Free,Serum     2.3 - 4.2 pg/mL 3.0  Calcium Ionized     4.8 - 5.6 mg/dL 8.114.65 (L)   Ionized calcium is slightly low, which is at target for postsurgical hypoparathyroidism.  I will advise her to take the calcium supplement daily. We will recheck this at next visit.   Carlus Pavlovristina Michell Kader, MD PhD [facility]

## 2018-07-21 LAB — CALCIUM, IONIZED: Calcium, Ion: 4.65 mg/dL — ABNORMAL LOW (ref 4.8–5.6)

## 2018-07-23 ENCOUNTER — Other Ambulatory Visit: Payer: Self-pay | Admitting: Internal Medicine

## 2018-07-23 DIAGNOSIS — E890 Postprocedural hypothyroidism: Secondary | ICD-10-CM

## 2018-07-23 MED ORDER — SYNTHROID 100 MCG PO TABS: 100.0000 ug | ORAL_TABLET | Freq: Every day | ORAL | 3 refills | Status: DC

## 2018-07-23 MED ORDER — CALCITRIOL 0.25 MCG PO CAPS: 0.2500 ug | ORAL_CAPSULE | Freq: Every day | ORAL | 3 refills | Status: DC

## 2018-07-23 MED ORDER — HYDROCHLOROTHIAZIDE 25 MG PO TABS: 25.0000 mg | ORAL_TABLET | Freq: Every day | ORAL | 3 refills | Status: DC

## 2018-07-23 MED ORDER — LIOTHYRONINE SODIUM 5 MCG PO TABS: 5.0000 ug | ORAL_TABLET | Freq: Every day | ORAL | 3 refills | Status: DC

## 2018-08-07 ENCOUNTER — Other Ambulatory Visit: Payer: Self-pay

## 2018-08-08 ENCOUNTER — Ambulatory Visit (INDEPENDENT_AMBULATORY_CARE_PROVIDER_SITE_OTHER): Payer: Managed Care, Other (non HMO) | Admitting: Obstetrics and Gynecology

## 2018-08-08 ENCOUNTER — Encounter: Payer: Self-pay | Admitting: Obstetrics and Gynecology

## 2018-08-08 VITALS — BP 110/62 | HR 76 | Temp 97.7°F | Resp 12 | Ht 64.0 in | Wt 127.0 lb

## 2018-08-08 DIAGNOSIS — Z8742 Personal history of other diseases of the female genital tract: Secondary | ICD-10-CM

## 2018-08-08 DIAGNOSIS — N761 Subacute and chronic vaginitis: Secondary | ICD-10-CM | POA: Diagnosis not present

## 2018-08-08 MED ORDER — LIDOCAINE 5 % EX OINT: 1.0000 "application " | TOPICAL_OINTMENT | Freq: Three times a day (TID) | CUTANEOUS | 1 refills | Status: DC

## 2018-08-08 MED ORDER — CLINDAMYCIN PHOSPHATE 2 % VA CREA: TOPICAL_CREAM | VAGINAL | 1 refills | Status: DC

## 2018-08-08 NOTE — Progress Notes (Signed)
GYNECOLOGY  VISIT   HPI: 58 y.o.   Married  Caucasian  female   G2P2002 with Patient's last menstrual period was 12/22/2014 (exact date).   here for vaginitis; per patient since about the end of March/early April she has had burning, redness, pain, discharge.  Hx vulvodynia and desquamative vaginitis.  States she has flares with infections.  She took Haitimnicef for a respiratory infection.  She tried Diflucan, Clindamycin vaginal cream, and then Amoxicillin, and her symptoms finally resolved. Then symptoms returned again with copious discharge and discomfort, and she started treating again with Clindamycin again.  She stops the medications, and her symptoms return.   States she is doing wet preps on herself and finding parabasal cells and some yeast.   Really wants testing for vaginal GBS.  States she had it in pregnancy and that this is participating in her ongoing vaginal problems.   Her glucose was normal, but tested several years ago.   Cut down on Vagifem use, and this has not made a difference.   No genital HSV.  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   No change in sexual partners.   GYNECOLOGIC HISTORY: Patient's last menstrual period was 12/22/2014 (exact date). Contraception:  Postmenopausal Menopausal hormone therapy:  Estradiol Last mammogram:  08-24-17 3D Diag.Bil/Neg/density c/BiRads2--Rt.Br.US Neg/screening 5049yr/BiRads1 Last pap smear:   02-05-16 Neg:Neg HR HPV        OB History    Gravida  2   Para  2   Term  2   Preterm  0   AB  0   Living  2     SAB  0   TAB  0   Ectopic  0   Multiple  0   Live Births  2              Patient Active Problem List   Diagnosis Date Noted  . Postsurgical hypothyroidism 07/20/2018  . Hypertension 02/24/2017  . Postsurgical hypoparathyroidism (HCC) 06/24/2016  . Aspiration pneumonia (HCC) 06/17/2016  . Burning chest pain 06/17/2016  . High risk medication use 04/01/2016  . Post-menopause 08/07/2015  .  Tinnitus 06/29/2015  . Benign essential hypertension 03/05/2015  . Malaise and fatigue 03/05/2015  . Palpitations 03/05/2015  . Peripheral neuropathy 03/05/2015  . PVC (premature ventricular contraction) 03/05/2015  . 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 Herpes infection 12/07/2012  . Yeast vaginitis 12/07/2012    Past Medical History:  Diagnosis Date  . Abnormal Pap smear of cervix    Hx of colposcopy but no treatment--paps normal since  . Chronic back pain   . Chronic vulvitis and desquamative vaginitis    bx benign hyperplasia  . HSV-1 infection   . Hypocalcemia   . IBS (irritable bowel syndrome)   . PVC (premature ventricular contraction) 2004  . Thyroid disease    Hypothyroidism, Hypoparathyroidism  . Yeast infection    Chronic    Past Surgical History:  Procedure Laterality Date  . BASAL CELL CARCINOMA EXCISION Left 05/2013  . BREAST ENHANCEMENT SURGERY  07/2007   Saline  . COLPOSCOPY     28 yrs ago  . DIAGNOSTIC LAPAROSCOPY  1988  . HNP Re-Repair  1/10  2009  . thumb surgery Left 1989  . THYROIDECTOMY  10/1995   due to thyroid nodule    Current Outpatient Medications  Medication Sig Dispense Refill  . acyclovir (ZOVIRAX) 400 MG tablet Take 1 tablet (400 mg total) by mouth 2 (two) times daily. 180 tablet 3  . amoxicillin (  AMOXIL) 875 MG tablet Take 1 tablet (875 mg total) by mouth as needed. 20 tablet 1  . aspirin EC 81 MG tablet Take 81 mg by mouth daily.    Marland Kitchen. BORIC ACID EX Apply topically as needed.    . calcitRIOL (ROCALTROL) 0.25 MCG capsule Take 1 capsule (0.25 mcg total) by mouth daily. 90 capsule 3  . calcium carbonate (OSCAL) 1500 (600 Ca) MG TABS tablet Take 600 mg of elemental calcium by mouth 2 (two) times daily with a meal.    . clindamycin (CLEOCIN) 2 % vaginal cream USE AT BEDTIME FOR 5 DAYS AS NEEDED. 40 g 3  . cyclobenzaprine (FLEXERIL) 5 MG tablet Take 5 mg by mouth as needed.    . desonide (DESOWEN) 0.05 % ointment Apply topically as needed. 60 g 1  .  diazepam (VALIUM) 5 MG tablet Take 5 mg by mouth as needed for muscle spasms.     Marland Kitchen. estradiol (ESTRACE) 1 MG tablet Take 1 tablet (1 mg total) by mouth daily. 90 tablet 3  . Estradiol 10 MCG TABS vaginal tablet One tablet vaginally twice weekly 24 tablet 3  . fexofenadine (ALLEGRA) 60 MG tablet Take 1 tablet by mouth daily.    . fluconazole (DIFLUCAN) 150 MG tablet Take 1 tablet (150 mg total) by mouth as needed. May repeat second dosage in 72 hours if needed. 12 tablet 1  . hydrochlorothiazide (HYDRODIURIL) 25 MG tablet Take 1 tablet (25 mg total) by mouth daily. 90 tablet 3  . liothyronine (CYTOMEL) 5 MCG tablet Take 1 tablet (5 mcg total) by mouth daily. 90 tablet 3  . Magnesium 500 MG CAPS Take by mouth.    . medroxyPROGESTERone (PROVERA) 5 MG tablet Take 1 tablet (5 mg total) by mouth daily. 90 tablet 3  . Multiple Vitamins-Minerals (MULTIVITAMIN PO) Take by mouth.    . Omega-3 Fatty Acids (FISH OIL PO) Take by mouth.    . potassium chloride (K-DUR) 10 MEQ tablet Take 10 mEq by mouth daily.  3  . pregabalin (LYRICA) 50 MG capsule Take 50 mg by mouth at bedtime.     Marland Kitchen. SYNTHROID 100 MCG tablet Take 1 tablet (100 mcg total) by mouth daily before breakfast. 90 tablet 3   No current facility-administered medications for this visit.      ALLERGIES: Famotidine, Macrobid [nitrofurantoin], Nsaids, Sulfa antibiotics, and Prednisone  Family History  Problem Relation Age of Onset  . Hypertension Mother   . Stroke Mother   . Thyroid disease Father        Hyper  . Osteoporosis Father   . Stroke Father        x2  . Diabetes Brother     Social History   Socioeconomic History  . Marital status: Married    Spouse name: Not on file  . Number of children: Not on file  . Years of education: Not on file  . Highest education level: Not on file  Occupational History  . Not on file  Social Needs  . Financial resource strain: Not on file  . Food insecurity    Worry: Not on file    Inability:  Not on file  . Transportation needs    Medical: Not on file    Non-medical: Not on file  Tobacco Use  . Smoking status: Never Smoker  . Smokeless tobacco: Never Used  Substance and Sexual Activity  . Alcohol use: Yes    Alcohol/week: 2.0 standard drinks    Types: 2  Glasses of wine per week  . Drug use: No  . Sexual activity: Yes    Partners: Male    Birth control/protection: Post-menopausal  Lifestyle  . Physical activity    Days per week: Not on file    Minutes per session: Not on file  . Stress: Not on file  Relationships  . Social Herbalist on phone: Not on file    Gets together: Not on file    Attends religious service: Not on file    Active member of club or organization: Not on file    Attends meetings of clubs or organizations: Not on file    Relationship status: Not on file  . Intimate partner violence    Fear of current or ex partner: Not on file    Emotionally abused: Not on file    Physically abused: Not on file    Forced sexual activity: Not on file  Other Topics Concern  . 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   HENT: Negative.   Eyes: Negative.   Respiratory: Negative.   Cardiovascular: Negative.   Gastrointestinal: Negative.   Endocrine: Negative.   Genitourinary: Positive for vaginal discharge and vaginal pain.       Burning Itching   Musculoskeletal: Negative.   Skin: Negative.   Allergic/Immunologic: Negative.   Neurological: Negative.   Hematological: Negative.   Psychiatric/Behavioral: Negative.     PHYSICAL EXAMINATION:    BP 110/62 (BP Location: Left Arm, Patient Position: Sitting, Cuff Size: Normal)   Pulse 76   Temp 97.7 F (36.5 C) (Temporal)   Resp 12   Ht 5\' 4"  (1.626 m)   Wt 127 lb (57.6 kg)   LMP 12/22/2014 (Exact Date)   BMI 21.80 kg/m     General appearance: alert, cooperative  and appears stated age  Pelvic: External genitalia:  no lesions              Urethra:  normal appearing urethra with no masses, tenderness or lesions              Bartholins and Skenes: normal                 Vagina: normal appearing vagina with normal color and discharge, no lesions              Cervix: no lesions                Bimanual Exam:  Uterus:  normal size, contour, position, consistency, mobility, non-tender              Adnexa: no mass, fullness, tenderness           Chaperone was present for exam.  ASSESSMENT  Chronic vaginitis.  Hx vulvodynia, desquamative vaginitis, and chronic yeast infection.  Hx HSV 1.  Intolerance to steroids.   PLAN  We discussed tx protocols for desquamative vaginitis in Up to Date. Refill of Clindamycin cream 4 grams pv at hs x 6 weeks.  RF one.  We reviewed the normal presence of GSB in the vaginal flora.  GBS culture per request. Nuswab.  I offered referral to Dr. Lamount Cohen, [facility]. She will consider this.    An After Visit Summary was printed and given to the patient.  __15____ minutes face to face time of which over 50% was  spent in counseling.

## 2018-08-12 LAB — CULTURE, BETA STREP (GROUP B ONLY): Strep Gp B Culture: NEGATIVE

## 2018-08-13 ENCOUNTER — Other Ambulatory Visit: Payer: Self-pay

## 2018-08-13 ENCOUNTER — Encounter: Payer: Self-pay | Admitting: Internal Medicine

## 2018-08-13 DIAGNOSIS — E890 Postprocedural hypothyroidism: Secondary | ICD-10-CM

## 2018-08-13 MED ORDER — CALCITRIOL 0.25 MCG PO CAPS: 0.2500 ug | ORAL_CAPSULE | Freq: Every day | ORAL | 3 refills | Status: DC

## 2018-08-13 MED ORDER — HYDROCHLOROTHIAZIDE 25 MG PO TABS: 25.0000 mg | ORAL_TABLET | Freq: Every day | ORAL | 3 refills | Status: DC

## 2018-08-13 MED ORDER — SYNTHROID 100 MCG PO TABS: 100.0000 ug | ORAL_TABLET | Freq: Every day | ORAL | 3 refills | Status: DC

## 2018-08-13 MED ORDER — LIOTHYRONINE SODIUM 5 MCG PO TABS: 5.0000 ug | ORAL_TABLET | Freq: Every day | ORAL | 3 refills | Status: DC

## 2018-08-15 LAB — NUSWAB VAGINITIS (VG)
Candida albicans, NAA: NEGATIVE
Candida glabrata, NAA: NEGATIVE
Trich vag by NAA: NEGATIVE

## 2018-08-16 ENCOUNTER — Other Ambulatory Visit: Payer: Self-pay | Admitting: Obstetrics and Gynecology

## 2018-08-16 ENCOUNTER — Other Ambulatory Visit: Payer: Self-pay | Admitting: Internal Medicine

## 2018-08-16 DIAGNOSIS — E890 Postprocedural hypothyroidism: Secondary | ICD-10-CM

## 2018-08-16 NOTE — Telephone Encounter (Signed)
Medication refill request: Diflucan  Last AEX:  03-09-2018 BS Next AEX: 03-15-19 Last MMG (if hormonal medication request): n/a Refill authorized: Today, please advise.   Medication pended for #12, 1RF. Please refill if appropriate.

## 2018-08-31 ENCOUNTER — Encounter: Payer: Self-pay | Admitting: Obstetrics and Gynecology

## 2018-09-05 ENCOUNTER — Other Ambulatory Visit: Payer: Self-pay | Admitting: Obstetrics and Gynecology

## 2018-10-05 ENCOUNTER — Encounter: Payer: Self-pay | Admitting: Pulmonary Disease

## 2018-10-05 ENCOUNTER — Ambulatory Visit: Payer: Managed Care, Other (non HMO) | Admitting: Pulmonary Disease

## 2018-10-05 ENCOUNTER — Other Ambulatory Visit: Payer: Self-pay

## 2018-10-05 ENCOUNTER — Institutional Professional Consult (permissible substitution): Payer: Managed Care, Other (non HMO) | Admitting: Pulmonary Disease

## 2018-10-05 DIAGNOSIS — J849 Interstitial pulmonary disease, unspecified: Secondary | ICD-10-CM | POA: Diagnosis not present

## 2018-10-05 LAB — CBC WITH DIFFERENTIAL/PLATELET
Basophils Absolute: 0 10*3/uL (ref 0.0–0.1)
Basophils Relative: 0.9 % (ref 0.0–3.0)
Eosinophils Absolute: 0.1 10*3/uL (ref 0.0–0.7)
Eosinophils Relative: 1.1 % (ref 0.0–5.0)
HCT: 37.9 % (ref 36.0–46.0)
Hemoglobin: 13 g/dL (ref 12.0–15.0)
Lymphocytes Relative: 30.1 % (ref 12.0–46.0)
Lymphs Abs: 1.5 10*3/uL (ref 0.7–4.0)
MCHC: 34.4 g/dL (ref 30.0–36.0)
MCV: 89.5 fl (ref 78.0–100.0)
Monocytes Absolute: 0.4 10*3/uL (ref 0.1–1.0)
Monocytes Relative: 7.3 % (ref 3.0–12.0)
Neutro Abs: 3 10*3/uL (ref 1.4–7.7)
Neutrophils Relative %: 60.6 % (ref 43.0–77.0)
Platelets: 229 10*3/uL (ref 150.0–400.0)
RBC: 4.23 Mil/uL (ref 3.87–5.11)
RDW: 12.7 % (ref 11.5–15.5)
WBC: 5 10*3/uL (ref 4.0–10.5)

## 2018-10-05 LAB — BRAIN NATRIURETIC PEPTIDE: Pro B Natriuretic peptide (BNP): 57 pg/mL (ref 0.0–100.0)

## 2018-10-05 NOTE — Patient Instructions (Addendum)
We will do some tests today including CBC differential, IgE, BNP We will schedule you for high-resolution CT for evaluation of dyspnea, interstitial lung disease  Follow-up in 2 weeks.

## 2018-10-05 NOTE — Progress Notes (Addendum)
Tabitha Clark    [id number]    [birth year]  Primary Care Physician:Grisso, Evlyn Courier., MD  Referring Physician: Gordan Payment., MD [address] [place],  [place] [address]  Chief complaint: Consult for dyspnea  HPI: 58 year old with history of hypothyroidism, hypoparathyroidism, PVCs, hypertension Complains of dyspnea on exertion since February 2020.  She was ill for several weeks in February.  No clear she was tested for COVID.  Since then she has had dyspnea on exertion.  No cough, sputum production. Dyspnea is exacerbated by wearing mask.  She recently had a chest x-ray which showed blunting of the costophrenic angles suspicious for minimal effusion versus scarring at the lung bases.  And has been referred to pulmonary for further evaluation.  She has history of pneumonia in May 2018, being followed by cardiology for PVCs.  She has occasional lower extremity edema  Pets: Has 2 outside cats.  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  She moved out in July 2020 Smoking history: Never smoker Travel history: No significant travel history Relevant family history: No significant family history of lung disease  Outpatient Encounter Medications as of 10/05/2018  Medication Sig  . acyclovir (ZOVIRAX) 400 MG tablet Take 1 tablet (400 mg total) by mouth 2 (two) times daily.  Marland Kitchen aspirin EC 81 MG tablet Take 81 mg by mouth daily.  Marland Kitchen BORIC ACID EX Apply topically as needed.  . calcitRIOL (ROCALTROL) 0.25 MCG capsule Take 1 capsule (0.25 mcg total) by mouth daily.  . calcium carbonate (OSCAL) 1500 (600 Ca) MG TABS tablet Take 600 mg of elemental calcium by mouth 2 (two) times daily with a meal.  . clindamycin (CLEOCIN) 2 % vaginal cream Place 4 grams vaginally at hs nightly for 6 weeks.  Dispense:  6 week supply.  RF one.  . cyclobenzaprine (FLEXERIL) 5 MG tablet Take 5 mg by mouth  as needed.  . desonide (DESOWEN) 0.05 % ointment Apply topically as needed.  . diazepam (VALIUM) 5 MG tablet Take 5 mg by mouth as needed for muscle spasms.   Marland Kitchen estradiol (ESTRACE) 1 MG tablet Take 1 tablet (1 mg total) by mouth daily.  . Estradiol 10 MCG TABS vaginal tablet One tablet vaginally twice weekly  . fexofenadine (ALLEGRA) 60 MG tablet Take 1 tablet by mouth daily.  . fluconazole (DIFLUCAN) 150 MG tablet TAKE 1 TABLET AS NEEDED.   MAY REPEAT SECOND DOSAGE IN72 HOURS IF NEEDED.  . fluticasone (FLONASE) 50 MCG/ACT nasal spray 1 spray by Each Nare route daily.  . hydrochlorothiazide (HYDRODIURIL) 25 MG tablet Take 1 tablet (25 mg total) by mouth daily.  Marland Kitchen lidocaine (XYLOCAINE) 5 % ointment Apply 1 application topically 3 (three) times daily. Takes as needed.  Marland Kitchen liothyronine (CYTOMEL) 5 MCG tablet Take 1 tablet (5 mcg total) by mouth daily.  . Magnesium 500 MG CAPS Take by mouth.  . medroxyPROGESTERone (PROVERA) 5 MG tablet Take 1 tablet (5 mg total) by mouth daily.  . Multiple Vitamins-Minerals (MULTIVITAMIN PO) Take by mouth.  . Omega-3 Fatty Acids (FISH OIL PO) Take by mouth.  . potassium chloride (K-DUR) 10 MEQ tablet Take 10 mEq by mouth daily.  . pregabalin (LYRICA) 50 MG capsule Take 50 mg by mouth at bedtime.   Marland Kitchen SYNTHROID 100 MCG tablet Take 1 tablet (100 mcg total) by mouth daily before breakfast.  .  vitamin B-12 (CYANOCOBALAMIN) 1000 MCG tablet Take by mouth.  . [DISCONTINUED] amoxicillin (AMOXIL) 875 MG tablet Take 1 tablet (875 mg total) by mouth as needed.   No facility-administered encounter medications on file as of 10/05/2018.     Allergies as of 10/05/2018 - Review Complete 10/05/2018  Allergen Reaction Noted  . Famotidine  03/05/2015  . Macrobid [nitrofurantoin] Nausea Only 12/06/2012  . Nsaids  12/06/2012  . Sulfa antibiotics Nausea Only 12/06/2012  . Prednisone Rash 03/05/2015    Past Medical History:  Diagnosis Date  . Abnormal Pap smear of cervix    Hx  of colposcopy but no treatment--paps normal since  . Chronic back pain   . Chronic vulvitis and desquamative vaginitis    bx benign hyperplasia  . HSV-1 infection   . Hypocalcemia   . IBS (irritable bowel syndrome)   . PVC (premature ventricular contraction) 2004  . Thyroid disease    Hypothyroidism, Hypoparathyroidism  . Yeast infection    Chronic    Past Surgical History:  Procedure Laterality Date  . BASAL CELL CARCINOMA EXCISION Left 05/2013  . BREAST ENHANCEMENT SURGERY  07/2007   Saline  . COLPOSCOPY     28 yrs ago  . DIAGNOSTIC LAPAROSCOPY  1988  . HNP Re-Repair  1/10  2009  . thumb surgery Left 1989  . THYROIDECTOMY  10/1995   due to thyroid nodule    Family History  Problem Relation Age of Onset  . Hypertension Mother   . Stroke Mother   . Thyroid disease Father        Hyper  . Osteoporosis Father   . Stroke Father        x2  . Diabetes Brother     Social History   Socioeconomic History  . Marital status: Married    Spouse name: Not on file  . Number of children: Not on file  . Years of education: Not on file  . Highest education level: Not on file  Occupational History  . Not on file  Social Needs  . Financial resource strain: Not on file  . Food insecurity    Worry: Not on file    Inability: Not on file  . Transportation needs    Medical: Not on file    Non-medical: Not on file  Tobacco Use  . Smoking status: Never Smoker  . Smokeless tobacco: Never Used  Substance and Sexual Activity  . Alcohol use: Yes    Alcohol/week: 2.0 standard drinks    Types: 2 Glasses of wine per week  . Drug use: No  . Sexual activity: Yes    Partners: Male    Birth control/protection: Post-menopausal  Lifestyle  . Physical activity    Days per week: Not on file    Minutes per session: Not on file  . Stress: Not on file  Relationships  . Social Herbalist on phone: Not on file    Gets together: Not on file    Attends religious service: Not on  file    Active member of club or organization: Not on file    Attends meetings of clubs or organizations: Not on file    Relationship status: Not on file  . Intimate partner violence    Fear of current or ex partner: Not on file    Emotionally abused: Not on file    Physically abused: Not on file    Forced sexual activity: Not on file  Other Topics Concern  .  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  HENT: Negative.   Eyes: Negative for blurred vision.  Respiratory: as per HPI  Cardiovascular: Negative for chest pain and palpitations.  Gastrointestinal: Negative for vomiting, diarrhea, blood per rectum. Genitourinary: Negative for dysuria, urgency, frequency and hematuria.  Musculoskeletal: Negative for myalgias, back pain and joint pain.  Skin: Negative for itching and rash.  Neurological: Negative for dizziness, tremors, focal weakness, seizures and loss of consciousness.  Endo/Heme/Allergies: Negative for environmental allergies.  Psychiatric/Behavioral: Negative for depression, suicidal ideas and hallucinations.  All other systems reviewed and are negative.  Physical Exam: Blood pressure 116/62, pulse 77, temperature (!) 96.3 F (35.7 C), temperature source Temporal, height 5' 4.5" (1.638 m), weight 127 lb (57.6 kg), last menstrual period 12/22/2014, SpO2 99 %. Gen:      No acute distress HEENT:  EOMI, sclera anicteric Neck:     No masses; no thyromegaly Lungs:    Clear to auscultation bilaterally; normal respiratory effort CV:         Regular rate and rhythm; no murmurs Abd:      + bowel sounds; soft, non-tender; no palpable masses, no distension Ext:    No edema; adequate peripheral perfusion Skin:      Warm and dry; no rash Neuro: alert and oriented x 3 Psych: normal mood and affect  Data Reviewed:  Imaging: Chest x-ray 09/07/18- biapical pleural thickening.  Possible pleural scarring versus small pleural effusion.  No acute cardiopulmonary disease.  I reviewed the images personally.    Assessment:  Evaluation for dyspnea, abnormal chest x-ray I have reviewed the chest x-ray shows very minimal changes at the bases.  Not sure if she has any interstitial lung disease at the base.  We will get a high-resolution CT for further evaluation Check CBC differential, IgE and BNP as she has history of intermittent lower extremity edema.  Further work-up based on above results.  Plan/Recommendations: - High-res CT - CBC, IgE, BNP Follow-up in 2 weeks  Chilton GreathousePraveen Ozzy Bohlken MD [facility] 10/05/2018, 10:17 AM  CC: Gordan PaymentGrisso, Greg A., MD

## 2018-10-08 ENCOUNTER — Telehealth: Payer: Self-pay | Admitting: Pulmonary Disease

## 2018-10-08 LAB — IGE: IgE (Immunoglobulin E), Serum: 153 kU/L — ABNORMAL HIGH (ref <=114)

## 2018-10-08 NOTE — Telephone Encounter (Signed)
LMTC x 1  

## 2018-10-08 NOTE — Telephone Encounter (Signed)
Called and spoke to patient. Patient stated she needs to speak to a [facility] about re-scheduling her CT scan that is currently scheduled for   Sched for 10/1 @ 4, check in by 3:45   Routing to [facility] pool.

## 2018-10-09 NOTE — Telephone Encounter (Signed)
I tried to call the patient to reschedule her Ct on 10/1 but had to LVM for her. I gave her Erline Levine at [facility]'s # [phone] to call and reschedule the CT

## 2018-10-10 NOTE — Telephone Encounter (Signed)
I signed this by accident pt is scheduled for 10/25/18 for her ct Dr Vaughan Browner has to do a peer to peer for her insurance to approve it Joellen [place]

## 2018-10-10 NOTE — Telephone Encounter (Signed)
CT not rescheduled yet

## 2018-10-12 ENCOUNTER — Telehealth: Payer: Self-pay

## 2018-10-12 NOTE — Telephone Encounter (Signed)
First available PFT is 11/02/2018 @ 4pm

## 2018-10-12 NOTE — Telephone Encounter (Signed)
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 She said she will call our office back to scheduled. First available PFT is 10/14 at 4pm  Will route to Dr. Vaughan Browner as Juluis Rainier

## 2018-10-12 NOTE — Telephone Encounter (Signed)
Will route to [facility] as CT is on 10/1 to see if she can get patient scheduled for PFT prior

## 2018-10-12 NOTE — Telephone Encounter (Signed)
Yes. That is fine.  

## 2018-10-12 NOTE — Telephone Encounter (Signed)
I was made aware that the peer to peer that was done by Dr. Vaughan Browner for patient's CT scan was denied because she has not had a PFT. I called and spoke with the patient to make her aware of this. I advised her that I would make the doctor aware and be in touch with her with what he recommends we do next. She verbalized understanding.

## 2018-10-12 NOTE — Telephone Encounter (Signed)
Dr. Vaughan Browner please advise if 10/4 is ok for PFT and we can reschedule CT?

## 2018-10-12 NOTE — Telephone Encounter (Signed)
Yes. PFTs are a requirement as per the insurance peer to peer. Please schedule them

## 2018-10-19 ENCOUNTER — Ambulatory Visit: Payer: Managed Care, Other (non HMO) | Admitting: Pulmonary Disease

## 2018-10-25 ENCOUNTER — Inpatient Hospital Stay: Admission: RE | Admit: 2018-10-25 | Payer: Managed Care, Other (non HMO) | Source: Ambulatory Visit

## 2018-11-16 ENCOUNTER — Telehealth: Payer: Self-pay | Admitting: Pulmonary Disease

## 2018-11-16 NOTE — Telephone Encounter (Signed)
Received fax from [facility] Department with patient being swabbed for covid on 11/14/2018 and test results being negative. Called and let patient know that she will be fine to come in for PFT on 11/19/2018.  Nothing further needed at this time.

## 2018-11-16 NOTE — Telephone Encounter (Signed)
Called and spoke w/ pt. Pt states she received a covid test from [facility] on 11/14/2018 and received negative results today. She has a PFT and f/u visit with Dr. Vaughan Browner scheduled for Monday 10/26. She states she received the covid test from [facility] because of a "statewide shortage" of tests.   After consulting my nursing supervisor, I suggested pt fax her results over as soon as possible in order to determine if she may still have the PFT performed. Pt verbalized understanding and is aware of the appropriate fax number. She states she will have the health department fax over the results today.   Routing to [employer], Therapist, sports, for f/u. Please advise if/when you receive pt's covid test results via fax. Thank you.

## 2018-11-19 ENCOUNTER — Other Ambulatory Visit: Payer: Self-pay

## 2018-11-19 ENCOUNTER — Other Ambulatory Visit: Payer: Self-pay | Admitting: Pulmonary Disease

## 2018-11-19 ENCOUNTER — Ambulatory Visit (INDEPENDENT_AMBULATORY_CARE_PROVIDER_SITE_OTHER): Payer: Managed Care, Other (non HMO) | Admitting: Pulmonary Disease

## 2018-11-19 DIAGNOSIS — J849 Interstitial pulmonary disease, unspecified: Secondary | ICD-10-CM

## 2018-11-19 LAB — PULMONARY FUNCTION TEST
DL/VA % pred: 110 %
DL/VA: 4.65 ml/min/mmHg/L
DLCO unc % pred: 109 %
DLCO unc: 23.08 ml/min/mmHg
FEF 25-75 Post: 3 L/sec
FEF 25-75 Pre: 2.63 L/sec
FEF2575-%Change-Post: 14 %
FEF2575-%Pred-Post: 120 %
FEF2575-%Pred-Pre: 105 %
FEV1-%Change-Post: 3 %
FEV1-%Pred-Post: 98 %
FEV1-%Pred-Pre: 94 %
FEV1-Post: 2.66 L
FEV1-Pre: 2.57 L
FEV1FVC-%Change-Post: 2 %
FEV1FVC-%Pred-Pre: 103 %
FEV6-%Change-Post: 1 %
FEV6-%Pred-Post: 94 %
FEV6-%Pred-Pre: 93 %
FEV6-Post: 3.2 L
FEV6-Pre: 3.15 L
FEV6FVC-%Pred-Post: 103 %
FEV6FVC-%Pred-Pre: 103 %
FVC-%Change-Post: 1 %
FVC-%Pred-Post: 91 %
FVC-%Pred-Pre: 90 %
FVC-Post: 3.2 L
FVC-Pre: 3.15 L
Post FEV1/FVC ratio: 83 %
Post FEV6/FVC ratio: 100 %
Pre FEV1/FVC ratio: 81 %
Pre FEV6/FVC Ratio: 100 %
RV % pred: 120 %
RV: 2.41 L
TLC % pred: 104 %
TLC: 5.45 L

## 2018-11-19 NOTE — Progress Notes (Signed)
PFT done today. 

## 2018-11-21 ENCOUNTER — Other Ambulatory Visit: Payer: Self-pay | Admitting: Obstetrics and Gynecology

## 2018-11-21 NOTE — Telephone Encounter (Signed)
Medication refill request: Fluconazole and Clindamycin cream Last AEX:  03/09/18 BS Next AEX: 03/15/19 Last MMG (if hormonal medication request): 08/31/18 BIRADS 1 negative Refill authorized: Please advise on refills; does patient need appointment?

## 2018-11-30 ENCOUNTER — Ambulatory Visit (INDEPENDENT_AMBULATORY_CARE_PROVIDER_SITE_OTHER)
Admission: RE | Admit: 2018-11-30 | Discharge: 2018-11-30 | Disposition: A | Discharge status: Home / Self Care | Payer: Managed Care, Other (non HMO) | Source: Ambulatory Visit | Attending: Pulmonary Disease | Admitting: Pulmonary Disease

## 2018-11-30 ENCOUNTER — Other Ambulatory Visit: Payer: Self-pay

## 2018-11-30 DIAGNOSIS — J849 Interstitial pulmonary disease, unspecified: Secondary | ICD-10-CM

## 2018-12-07 ENCOUNTER — Encounter: Payer: Self-pay | Admitting: Pulmonary Disease

## 2018-12-07 ENCOUNTER — Other Ambulatory Visit: Payer: Self-pay

## 2018-12-07 ENCOUNTER — Ambulatory Visit: Payer: Managed Care, Other (non HMO) | Admitting: Pulmonary Disease

## 2018-12-07 VITALS — BP 120/80 | HR 74 | Temp 97.9°F | Ht 65.0 in | Wt 122.4 lb

## 2018-12-07 DIAGNOSIS — R0600 Dyspnea, unspecified: Secondary | ICD-10-CM

## 2018-12-07 NOTE — Progress Notes (Signed)
Tabitha Clark    [id number]    [birth year]  Primary Care Physician:Grisso, Evlyn Courier., MD  Referring Physician: Gordan Payment., MD [address] [place],  [place] [address]  Chief complaint: Follow-up for dyspnea  HPI: 58 year old with history of hypothyroidism, hypoparathyroidism, PVCs, hypertension Complains of dyspnea on exertion since February 2020.  She was ill for several weeks in February.  No clear she was tested for COVID.  Since then she has had dyspnea on exertion.  No cough, sputum production. Dyspnea is exacerbated by wearing mask.  She recently had a chest x-ray which showed blunting of the costophrenic angles suspicious for minimal effusion versus scarring at the lung bases.  And has been referred to pulmonary for further evaluation.  She has history of pneumonia in May 2018, being followed by cardiology for PVCs.  She has occasional lower extremity edema  Pets: Has 2 outside cats.  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  She moved out in July 2020 Smoking history: Never smoker Travel history: No significant travel history Relevant family history: No significant family history of lung disease  Interim history:  Patient is here for review of CT and PFTs States her breathing is stable with mild dyspnea on exertion.  No cough, sputum production  She is grieving as she lost her mother a few weeks ago and is tearful.  Outpatient Encounter Medications as of 12/07/2018  Medication Sig  . acyclovir (ZOVIRAX) 400 MG tablet Take 1 tablet (400 mg total) by mouth 2 (two) times daily.  Marland Kitchen aspirin EC 81 MG tablet Take 81 mg by mouth daily.  Marland Kitchen BORIC ACID EX Apply topically as needed.  . calcitRIOL (ROCALTROL) 0.25 MCG capsule Take 1 capsule (0.25 mcg total) by mouth daily.  . calcium carbonate (OSCAL) 1500 (600 Ca) MG TABS tablet Take 600 mg of elemental calcium  by mouth 2 (two) times daily with a meal.  . clindamycin (CLEOCIN) 2 % vaginal cream USE AT BEDTIME FOR 5 DAYS  AS NEEDED  . cyclobenzaprine (FLEXERIL) 5 MG tablet Take 5 mg by mouth as needed.  . desonide (DESOWEN) 0.05 % ointment Apply topically as needed.  . diazepam (VALIUM) 5 MG tablet Take 5 mg by mouth as needed for muscle spasms.   Marland Kitchen estradiol (ESTRACE) 1 MG tablet Take 1 tablet (1 mg total) by mouth daily.  . Estradiol 10 MCG TABS vaginal tablet One tablet vaginally twice weekly  . fexofenadine (ALLEGRA) 60 MG tablet Take 1 tablet by mouth daily.  . fluconazole (DIFLUCAN) 150 MG tablet TAKE 1 TABLET AS NEEDED.   MAY REPEAT SECOND DOSAGE IN72 HOURS IF NEEDED.  . fluticasone (FLONASE) 50 MCG/ACT nasal spray 1 spray by Each Nare route daily.  . hydrochlorothiazide (HYDRODIURIL) 25 MG tablet Take 1 tablet (25 mg total) by mouth daily.  Marland Kitchen lidocaine (XYLOCAINE) 5 % ointment Apply 1 application topically 3 (three) times daily. Takes as needed.  Marland Kitchen liothyronine (CYTOMEL) 5 MCG tablet Take 1 tablet (5 mcg total) by mouth daily.  . Magnesium 500 MG CAPS Take by mouth.  . medroxyPROGESTERone (PROVERA) 5 MG tablet Take 1 tablet (5 mg total) by mouth daily.  . Multiple Vitamins-Minerals (MULTIVITAMIN PO) Take by mouth.  . Omega-3 Fatty Acids (FISH OIL PO) Take by mouth.  . potassium chloride (K-DUR) 10 MEQ tablet Take 10 mEq by mouth  daily.  . pregabalin (LYRICA) 50 MG capsule Take 50 mg by mouth at bedtime.   Marland Kitchen SYNTHROID 100 MCG tablet Take 1 tablet (100 mcg total) by mouth daily before breakfast.  . vitamin B-12 (CYANOCOBALAMIN) 1000 MCG tablet Take by mouth.   No facility-administered encounter medications on file as of 12/07/2018.    Physical Exam: Blood pressure 120/80, pulse 74, temperature 97.9 F (36.6 C), temperature source Temporal, height 5\' 5"  (1.651 m), weight 122 lb 6.4 oz (55.5 kg), last menstrual period 12/22/2014, SpO2 99 %. Gen:      No acute distress HEENT:  EOMI, sclera  anicteric Neck:     No masses; no thyromegaly Lungs:    Clear to auscultation bilaterally; normal respiratory effort CV:         Regular rate and rhythm; no murmurs Abd:      + bowel sounds; soft, non-tender; no palpable masses, no distension Ext:    No edema; adequate peripheral perfusion Skin:      Warm and dry; no rash Neuro: alert and oriented x 3 Psych: normal mood and affect  Data Reviewed: Imaging: Chest x-ray 09/07/18- biapical pleural thickening.  Possible pleural scarring versus small pleural effusion.  No acute cardiopulmonary disease.    High-res CT 11/30/2018-no evidence of interstitial lung disease.  No acute cardiopulmonary abnormality.  Mild biapical pleural-parenchymal scarring. I have reviewed the images personally  PFTs: 11/19/2018-FVC 3.20 [91%), FEV1 2.66 [98%], F/F 83, TLC 5.45 [104%], DLCO 23 [109%] Normal test  Labs: CBC 10/05/2018-WBC 5, eos 1.1%, absolute eosinophil count 55 IgE 10/05/2018-153 proBNP 10/05/2018-57  Assessment:  Evaluation for dyspnea, abnormal chest x-ray Follow-up high-res CT reviewed with no evidence of interstitial lung disease.  She has mild scarring at the apices which is nonspecific.  PFTs do not show any pulmonary impairment  Reassured the patient.  Suspect her dyspnea may be secondary to deconditioning Advised exercise regimen. Can follow-up in pulmonary as needed  Plan/Recommendations: - Follow-up as needed.  Marshell Garfinkel MD [facility] 12/07/2018, 9:08 AM  CC: Raina Mina., MD

## 2018-12-07 NOTE — Patient Instructions (Signed)
I have reviewed your CT and PFTs which show no evidence of lung abnormality There is minimal scarring at the apex of your lung which we see commonly in patients.  It is of no clinical significance Your tests are reassuring You can follow-up in pulmonary clinic as needed.  Please call with any questions

## 2018-12-25 ENCOUNTER — Other Ambulatory Visit: Payer: Self-pay | Admitting: Obstetrics and Gynecology

## 2018-12-25 NOTE — Telephone Encounter (Signed)
Medication refill request: Diflucan  Last AEX:  03-09-2018 BS  Next AEX: 03-15-19 Last MMG (if hormonal medication request): n/a Refill authorized: Today, please advise.   Medication pended for #2, 0RF. Please refill if appropriate.

## 2019-01-15 ENCOUNTER — Other Ambulatory Visit: Payer: Self-pay | Admitting: Certified Nurse Midwife

## 2019-01-15 DIAGNOSIS — N951 Menopausal and female climacteric states: Secondary | ICD-10-CM

## 2019-02-19 ENCOUNTER — Other Ambulatory Visit: Payer: Self-pay | Admitting: Obstetrics and Gynecology

## 2019-02-19 DIAGNOSIS — N761 Subacute and chronic vaginitis: Secondary | ICD-10-CM

## 2019-02-19 NOTE — Telephone Encounter (Signed)
Med refill request: Diflucan Last AEX: 03/09/2018 BS Next AEX: 03/15/2019 Last MMG (if hormonal med) n/a Refill authorized: #2, 0RF pended if approved.

## 2019-02-25 ENCOUNTER — Other Ambulatory Visit: Payer: Self-pay | Admitting: Obstetrics and Gynecology

## 2019-02-25 DIAGNOSIS — N951 Menopausal and female climacteric states: Secondary | ICD-10-CM

## 2019-02-25 DIAGNOSIS — N952 Postmenopausal atrophic vaginitis: Secondary | ICD-10-CM

## 2019-02-25 NOTE — Telephone Encounter (Signed)
Medication refill request: Provera and Estradiol Last AEX:  03/09/18 BS Next AEX: 04/18/19 Last MMG (if hormonal medication request): 08/31/18 BIRADS 1 negative/density c Refill authorized: Please advise; Orders pended for #90 w/0 refills if authorized

## 2019-03-15 ENCOUNTER — Ambulatory Visit: Payer: Managed Care, Other (non HMO) | Admitting: Obstetrics and Gynecology

## 2019-04-02 ENCOUNTER — Other Ambulatory Visit: Payer: Self-pay | Admitting: Obstetrics and Gynecology

## 2019-04-02 DIAGNOSIS — N951 Menopausal and female climacteric states: Secondary | ICD-10-CM

## 2019-04-11 ENCOUNTER — Other Ambulatory Visit: Payer: Self-pay

## 2019-04-11 NOTE — Telephone Encounter (Signed)
Tabitha Clark from [employer] is calling regarding medication estradiol vaginal tablets. Call reference number [id number]

## 2019-04-12 NOTE — Telephone Encounter (Signed)
Call to number provided, when prompted was advised to call [facility] number on card.   Call placed to [facility] at [phone].   Left message on automated system for return call to call Noreene Larsson, RN at [facility] [phone].

## 2019-04-15 ENCOUNTER — Encounter: Payer: Self-pay | Admitting: Certified Nurse Midwife

## 2019-04-16 NOTE — Telephone Encounter (Signed)
Call to [facility], spoke with Kennon Rounds. Was advised estradiol 1mg  tab PO daily out of refills, request refill.   No refills authorized. Rx sent on 02/25/19 #90 tabs/0RF.   Patient is scheduled for AEX on 04/18/19 with Dr. 04/20/19.  Last AEX 03/09/18. MMG 08/31/18. Needs AEX for refills.   Call to patient, no answer, no option to leave voicemail.

## 2019-04-17 ENCOUNTER — Other Ambulatory Visit: Payer: Self-pay

## 2019-04-18 ENCOUNTER — Encounter: Payer: Self-pay | Admitting: Obstetrics & Gynecology

## 2019-04-18 ENCOUNTER — Ambulatory Visit: Payer: Managed Care, Other (non HMO) | Admitting: Obstetrics & Gynecology

## 2019-04-18 VITALS — BP 122/70 | HR 84 | Temp 97.9°F | Resp 10 | Ht 65.75 in | Wt 123.4 lb

## 2019-04-18 DIAGNOSIS — N951 Menopausal and female climacteric states: Secondary | ICD-10-CM

## 2019-04-18 DIAGNOSIS — N952 Postmenopausal atrophic vaginitis: Secondary | ICD-10-CM | POA: Diagnosis not present

## 2019-04-18 DIAGNOSIS — Z79890 Hormone replacement therapy (postmenopausal): Secondary | ICD-10-CM

## 2019-04-18 DIAGNOSIS — Z01419 Encounter for gynecological examination (general) (routine) without abnormal findings: Secondary | ICD-10-CM

## 2019-04-18 MED ORDER — DESONIDE 0.05 % EX OINT: TOPICAL_OINTMENT | CUTANEOUS | 1 refills | Status: DC | PRN

## 2019-04-18 MED ORDER — CLINDAMYCIN PHOSPHATE 2 % VA CREA: TOPICAL_CREAM | VAGINAL | 3 refills | Status: DC

## 2019-04-18 MED ORDER — ESTRADIOL 10 MCG VA TABS: ORAL_TABLET | VAGINAL | 3 refills | Status: DC

## 2019-04-18 MED ORDER — FLUCONAZOLE 150 MG PO TABS: ORAL_TABLET | ORAL | 8 refills | Status: DC

## 2019-04-18 MED ORDER — ESTRADIOL 1 MG PO TABS: 1.0000 mg | ORAL_TABLET | Freq: Every day | ORAL | 4 refills | Status: DC

## 2019-04-18 MED ORDER — MEDROXYPROGESTERONE ACETATE 5 MG PO TABS: 5.0000 mg | ORAL_TABLET | Freq: Every day | ORAL | 4 refills | Status: DC

## 2019-04-18 NOTE — Progress Notes (Signed)
59 y.o. G34P2002 Married White or Caucasian female here for annual exam.  Denies vaginal bleeding.  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  Saw Dr. Edward Jolly in July.    PCP:  Dr. Shary Decamp.    Patient's last menstrual period was 12/22/2014 (exact date).          Sexually active: Yes.    The current method of family planning is post menopausal status.    Exercising: No.  The patient does not participate in regular exercise at present. Smoker:  no  Health Maintenance: Pap:  02-05-16 Neg:Neg HR HPV  12-07-12 Neg:Neg HR HPV History of abnormal Pap:  Yes, 30 years ago of colonoscopy but no treatment MMG:  08/31/18 BIRADS 1 negative/density c Colonoscopy:  2013 normal --didn't see complete colon.  Dr. Madilyn Fireman.  Pt aware BMD:   06-01-12  Result:  Normal TDaP:  UTD with work Pneumonia vaccine(s):  n/a Shingrix:   Declines for now Hep C testing: 2017 Neg Screening Labs: PCP   reports that she has never smoked. She has never used smokeless tobacco. She reports current alcohol use of about 2.0 standard drinks of alcohol per week. She reports that she does not use drugs.  Past Medical History:  Diagnosis Date  . Abnormal Pap smear of cervix    Hx of colposcopy but no treatment--paps normal since  . Chronic back pain   . Chronic vulvitis and desquamative vaginitis    bx benign hyperplasia  . HSV-1 infection   . Hypocalcemia   . IBS (irritable bowel syndrome)   . PVC (premature ventricular contraction) 2004  . Thyroid disease    Hypothyroidism, Hypoparathyroidism  . Yeast infection    Chronic    Past Surgical History:  Procedure Laterality Date  . BASAL CELL CARCINOMA EXCISION Left 05/2013  . BREAST ENHANCEMENT SURGERY  07/2007   Saline  . COLPOSCOPY     28 yrs ago  . DIAGNOSTIC LAPAROSCOPY  1988  . HNP Re-Repair  1/10  2009  . thumb surgery Left 1989  . THYROIDECTOMY  10/1995   due to thyroid nodule    Current Outpatient Medications  Medication Sig Dispense Refill  .  acyclovir (ZOVIRAX) 400 MG tablet Take 1 tablet (400 mg total) by mouth 2 (two) times daily. 180 tablet 3  . aspirin EC 81 MG tablet Take 81 mg by mouth daily.    . calcium carbonate (OSCAL) 1500 (600 Ca) MG TABS tablet Take 600 mg of elemental calcium by mouth 2 (two) times daily with a meal.    . clindamycin (CLEOCIN) 2 % vaginal cream USE AT BEDTIME FOR 5 DAYS  AS NEEDED 40 g 3  . desonide (DESOWEN) 0.05 % ointment Apply topically as needed. 60 g 1  . estradiol (ESTRACE) 1 MG tablet TAKE 1 TABLET DAILY 90 tablet 0  . Estradiol 10 MCG TABS vaginal tablet One tablet vaginally twice weekly 24 tablet 3  . fexofenadine (ALLEGRA) 60 MG tablet Take 1 tablet by mouth daily.    . fluconazole (DIFLUCAN) 150 MG tablet TAKE 1 TABLET AS NEEDED.   MAY REPEAT SECOND DOSAGE IN72 HOURS IF NEEDED. 2 tablet 0  . fluticasone (FLONASE) 50 MCG/ACT nasal spray 1 spray by Each Nare route daily.    . hydrochlorothiazide (HYDRODIURIL) 25 MG tablet Take 1 tablet (25 mg total) by mouth daily. 90 tablet 3  . hydrOXYzine (ATARAX/VISTARIL) 25 MG tablet Take by mouth as needed.    Marland Kitchen liothyronine (CYTOMEL) 5 MCG  tablet Take 1 tablet (5 mcg total) by mouth daily. 90 tablet 3  . Magnesium 500 MG CAPS Take by mouth.    . medroxyPROGESTERone (PROVERA) 5 MG tablet TAKE 1 TABLET DAILY 90 tablet 0  . Multiple Vitamins-Minerals (MULTIVITAMIN PO) Take by mouth.    . Omega-3 Fatty Acids (FISH OIL PO) Take by mouth.    . potassium chloride (K-DUR) 10 MEQ tablet Take 10 mEq by mouth daily.  3  . pregabalin (LYRICA) 50 MG capsule Take 50 mg by mouth at bedtime.     Marland Kitchen SYNTHROID 100 MCG tablet Take 1 tablet (100 mcg total) by mouth daily before breakfast. 90 tablet 3  . vitamin B-12 (CYANOCOBALAMIN) 1000 MCG tablet Take by mouth.    . BORIC ACID EX Apply topically as needed.    . calcitRIOL (ROCALTROL) 0.25 MCG capsule Take 1 capsule (0.25 mcg total) by mouth daily. (Patient not taking: Reported on 04/18/2019) 90 capsule 3  .  cyclobenzaprine (FLEXERIL) 5 MG tablet Take 5 mg by mouth as needed.    . diazepam (VALIUM) 5 MG tablet Take 5 mg by mouth as needed for muscle spasms.      No current facility-administered medications for this visit.    Family History  Problem Relation Age of Onset  . Hypertension Mother   . Stroke Mother   . Thyroid disease Father        Hyper  . Osteoporosis Father   . Stroke Father        x2  . Diabetes Brother     Review of Systems  All other systems reviewed and are negative.   Exam:   BP 122/70 (BP Location: Left Arm, Patient Position: Sitting, Cuff Size: Normal)   Pulse 84   Temp 97.9 F (36.6 C) (Temporal)   Resp 10   Ht 5' 5.75" (1.67 m)   Wt 123 lb 6.4 oz (56 kg)   LMP 12/22/2014 (Exact Date)   BMI 20.07 kg/m   Height: 5' 5.75" (167 cm)  Ht Readings from Last 3 Encounters:  04/18/19 5' 5.75" (1.67 m)  12/07/18 5\' 5"  (1.651 m)  10/05/18 5' 4.5" (1.638 m)    General appearance: alert, cooperative and appears stated age Head: Normocephalic, without obvious abnormality, atraumatic Neck: no adenopathy, supple, symmetrical, trachea midline and thyroid normal to inspection and palpation Lungs: clear to auscultation bilaterally Breasts: normal appearance, no masses or tenderness Heart: regular rate and rhythm Abdomen: soft, non-tender; bowel sounds normal; no masses,  no organomegaly Extremities: extremities normal, atraumatic, no cyanosis or edema Skin: Skin color, texture, turgor normal. No rashes or lesions Lymph nodes: Cervical, supraclavicular, and axillary nodes normal. No abnormal inguinal nodes palpated Neurologic: Grossly normal   Pelvic: External genitalia:  no lesions              Urethra:  normal appearing urethra with no masses, tenderness or lesions              Bartholins and Skenes: normal                 Vagina: normal appearing vagina with normal color and discharge, no lesions              Cervix: no lesions              Pap taken:  No. Bimanual Exam:  Uterus:  normal size, contour, position, consistency, mobility, non-tender              Adnexa:  normal adnexa and no mass, fullness, tenderness               Rectovaginal: Confirms               Anus:  normal sphincter tone, no lesions  Chaperone, Zenovia Jordan, CMA, was present for exam.  A:  Well Woman with normal exam PMP, on HRT Bilateral breast implants H/o vulvodynia, DIV, and chronic yeast vaginitis H/o HSV 1 Hypothyroidism, on synthroid Hypoparathyroidism, followed by Dr. Elvera Lennox  P:   Mammogram guidelines reviewed pap smear neg with neg HR HPV 1/18.  Newest ASCCP guidelines reviewed.  Plan HR HPV based screening next year Cologuard ordered.  Pt has incomplete colonoscopy and is overdue.  Aware if abnormal will need colonoscopy and will be done as diagnostic. BMD discussed.  Plan to repeat with next MMG.  Order will be faxed to [facility].   RF for estradiol/provera orally, vagifem, diflucan, clindamycin cream, and diflucan.  These should last pt thorough the entire year. return annually or prn

## 2019-04-23 NOTE — Telephone Encounter (Signed)
Pt seen on 04/18/19 for AEX with Dr Hyacinth Meeker. Estradiol refilled at time of appt.   Encounter closed.

## 2019-04-24 ENCOUNTER — Other Ambulatory Visit: Payer: Self-pay

## 2019-04-24 DIAGNOSIS — B009 Herpesviral infection, unspecified: Secondary | ICD-10-CM

## 2019-04-24 MED ORDER — ACYCLOVIR 400 MG PO TABS: 400.0000 mg | ORAL_TABLET | Freq: Two times a day (BID) | ORAL | 3 refills | Status: DC

## 2019-04-24 NOTE — Telephone Encounter (Signed)
Medication refill request: Zovirax Last AEX:  04/18/19 SM Next AEX: 07/24/20 Last MMG (if hormonal medication request): 08/31/18 BIRADS 1 negative/density c Refill authorized: Please advise; Dr. Hyacinth Meeker out of office 04/24/19. Order pended #180 w/3 refills if authorized.

## 2019-05-21 ENCOUNTER — Other Ambulatory Visit: Payer: Self-pay | Admitting: Obstetrics and Gynecology

## 2019-05-21 DIAGNOSIS — N952 Postmenopausal atrophic vaginitis: Secondary | ICD-10-CM

## 2019-05-21 DIAGNOSIS — N951 Menopausal and female climacteric states: Secondary | ICD-10-CM

## 2019-05-30 ENCOUNTER — Other Ambulatory Visit: Payer: Self-pay | Admitting: Obstetrics & Gynecology

## 2019-05-30 DIAGNOSIS — Z1211 Encounter for screening for malignant neoplasm of colon: Secondary | ICD-10-CM

## 2019-05-30 DIAGNOSIS — Z1212 Encounter for screening for malignant neoplasm of rectum: Secondary | ICD-10-CM

## 2019-05-30 LAB — COLOGUARD: Cologuard: NEGATIVE

## 2019-05-30 NOTE — Progress Notes (Signed)
Patient noitfied of negative cologuard result

## 2019-08-02 ENCOUNTER — Ambulatory Visit: Payer: Managed Care, Other (non HMO) | Admitting: Internal Medicine

## 2019-08-02 ENCOUNTER — Other Ambulatory Visit: Payer: Self-pay

## 2019-08-02 ENCOUNTER — Encounter: Payer: Self-pay | Admitting: Internal Medicine

## 2019-08-02 VITALS — BP 130/72 | HR 76 | Ht 65.75 in | Wt 122.2 lb

## 2019-08-02 DIAGNOSIS — E890 Postprocedural hypothyroidism: Secondary | ICD-10-CM

## 2019-08-02 DIAGNOSIS — E892 Postprocedural hypoparathyroidism: Secondary | ICD-10-CM | POA: Diagnosis not present

## 2019-08-02 LAB — T3, FREE: T3, Free: 3.6 pg/mL (ref 2.3–4.2)

## 2019-08-02 LAB — TSH: TSH: 0.61 u[IU]/mL (ref 0.35–4.50)

## 2019-08-02 LAB — T4, FREE: Free T4: 1.29 ng/dL (ref 0.60–1.60)

## 2019-08-02 NOTE — Patient Instructions (Signed)
Please stop at the lab.  Continue Synthroid 100 mcg + liothyronine 5 mcg daily.  Take the thyroid hormone every day, with water, at least 30 minutes before breakfast, separated by at least 4 hours from: - acid reflux medications - calcium - iron - multivitamins  Please come back for a follow-up appointment in 1 year. 

## 2019-08-02 NOTE — Progress Notes (Signed)
Patient ID: Tabitha LikesRebecca C Jeune, female   DOB: 1960-12-11, 59 y.o.   MRN: [id number]   This visit occurred during the SARS-CoV-2 public health emergency.  Safety protocols were in place, including screening questions prior to the visit, additional usage of staff PPE, and extensive cleaning of exam room while observing appropriate contact time as indicated for disinfecting solutions.   HPI  Tabitha Clark is a 59 y.o.-year-old very pleasant female, initially referred by Ria CommentPatricia Grubb, FNP, returning for follow-up for postsurgical hypothyroidism and hypoparathyroidism, dx'ed in 1997 after thyroidectomy for a thyroid nodule (turned out to be benign).  Last visit 1 year ago.  She had shortness of breath and during investigation she was found to have interstitial lung disease in 09/2018.  She also had a lot of stress as her mother died in 11/2018. Her father is now living with her.  Postsurgical hypothyroidism   She is on Synthroid d.a.w. 100 mcg + liothyronine generic 5 mcg daily.  She tried generic levothyroxine developed abdominal pain.  She also needs to have liothyronine, otherwise, she has extreme fatigue.  Pt takes the thyroid hormones: - in am - fasting - at least 30 min-1h  from b'fast - + Ca, MVI 1 to 1.5 hours after levothyroxine (she does this every day) - no Fe, PPIs - not on Biotin  Reviewed patient's TFTs: Lab Results  Component Value Date   TSH 1.00 07/20/2018   TSH 1.080 01/12/2018   TSH 1.57 07/21/2017   TSH 0.90 01/13/2017   FREET4 0.97 07/20/2018   FREET4 1.58 01/12/2018   FREET4 0.97 07/21/2017   FREET4 1.11 01/13/2017  06/24/2016: TSH 0.853 04/11/2016: TSH 0.407 06/29/2015: TSH 0.03, free t4: 1.06, free t3 2.58 (LT4 dose decreased to 100 mcg daily) TPO Abs (03/07/2014): 37 (<34).  She had PVCs when she tried to decrease her levothyroxine dose (!).  She stopped OCPs in 2016 and had diet change (eliminated gluten and dairy) >> lost weight.  Pt denies: -  feeling nodules in neck - hoarseness - dysphagia - choking - SOB with lying down  She has + FH of thyroid disorders in: father >> Htyr. No FH of thyroid cancer. No h/o radiation tx to head or neck.  No herbal supplements. No Biotin use. No recent steroids use (allergic to steroids).   Postsurgical hypoparathyroidism.  She continues on: - vitamin D + Ca (Caltrate): 400 units + 600 mg - on Ca 2x a day - Multivitamin: 200 mg calcium + ? Vitamin D - calcitriol 0.25 mcg daily with breakfast - Magnesium 500 mg daily - HCTZ 25  mg in a.m. >> no dehydration, occasional dizziness (possibly from Mnire's disease, reportedly).  She has slightly low calcium levels in the past, which are actually at goal for postsurgical hypocalcemia:   Component     Latest Ref Rng & Units 07/20/2018  Calcium Ionized     4.8 - 5.6 mg/dL 2.134.65 (L)   Component     Latest Ref Rng & Units 01/12/2018  Calcium Ionized     4.8 - 5.6 mg/dL 5.1   Lab Results  Component Value Date   CALCIUM 8.4 (L) 07/21/2017  04/01/2016: Ca 9.0 06/29/2015: Ca 10.5, PTH <1  Magnesium and phosphorus normal: Lab Results  Component Value Date   MG 1.8 01/13/2017   PHOS 3.6 01/13/2017   Vitamin D levels normal: Lab Results  Component Value Date   VD25OH 52.44 07/21/2017   VD25OH 55.53 01/13/2017    Her 24-hour urine  calcium levels were normal: Component     Latest Ref Rng & Units 02/10/2017  Calcium, 24H Urine     mg/24 h 235  Creatinine, 24H Ur     0.50 - 2.15 g/24 h 1.34  08/14/2013: 137 06/11/2012: 132  She may get hypoglycemic symptoms if she gets dehydrated. Has occasional tingling hands >> resolved by taking Tums. She is also on potassium 10 mEq daily for muscle cramps.   ROS: Constitutional: no weight gain/no weight loss, no fatigue, no subjective hyperthermia, no subjective hypothermia Eyes: no blurry vision, no xerophthalmia ENT: no sore throat, + see HPI Cardiovascular: no CP/no SOB/no palpitations/no  leg swelling Respiratory: no cough/no SOB/no wheezing Gastrointestinal: no N/no V/no D/no C/no acid reflux Musculoskeletal: no muscle aches/no joint aches Skin: no rashes, no hair loss Neurological: no tremors/no numbness/no tingling/no dizziness  I reviewed pt's medications, allergies, PMH, social hx, family hx, and changes were documented in the history of present illness. Otherwise, unchanged from my initial visit note.  Past Medical History:  Diagnosis Date  . Abnormal Pap smear of cervix    Hx of colposcopy but no treatment--paps normal since  . Chronic back pain   . Chronic vulvitis and desquamative vaginitis    bx benign hyperplasia  . HSV-1 infection   . Hypocalcemia   . IBS (irritable bowel syndrome)   . PVC (premature ventricular contraction) 2004  . Thyroid disease    Hypothyroidism, Hypoparathyroidism  . Yeast infection    Chronic   Past Surgical History:  Procedure Laterality Date  . BASAL CELL CARCINOMA EXCISION Left 05/2013  . BREAST ENHANCEMENT SURGERY  07/2007   Saline  . COLPOSCOPY     28 yrs ago  . DIAGNOSTIC LAPAROSCOPY  1988  . HNP Re-Repair  1/10  2009  . thumb surgery Left 1989  . THYROIDECTOMY  10/1995   due to thyroid nodule  L4-L5 laser Sx 2009 and 2010 L radial nerve repair 1989  Social History   Social History  . Marital status: Married    Spouse name: N/A  . Number of children: 2   Occupational History  . 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 Smoking status: Never Smoker  . Smokeless tobacco: Never Used  . Alcohol use          Comment: 1-2 glasses of wine every mo  . Drug use: No  . 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 acyclovir (ZOVIRAX) 400 MG tablet Take 1 tablet  (400 mg total) by mouth 2 (two) times daily. 180 tablet 3  . aspirin EC 81 MG tablet Take 81 mg by mouth daily.    Marland Kitchen BORIC ACID EX Apply topically as needed.    . calcium carbonate (OSCAL) 1500 (600 Ca) MG TABS tablet Take 600 mg of elemental calcium by mouth 2 (two) times daily with a meal.    . clindamycin (CLEOCIN) 2 % vaginal cream USE AT BEDTIME FOR 5 DAYS  AS NEEDED 40 g 3  . cyclobenzaprine (FLEXERIL) 5 MG tablet Take 5 mg by mouth as needed.    . desonide (DESOWEN) 0.05 % ointment Apply topically as needed. 60 g 1  . diazepam (VALIUM) 5 MG tablet Take 5  mg by mouth as needed for muscle spasms.     Marland Kitchen estradiol (ESTRACE) 1 MG tablet Take 1 tablet (1 mg total) by mouth daily. 90 tablet 4  . Estradiol 10 MCG TABS vaginal tablet One tablet vaginally twice weekly 24 tablet 3  . fexofenadine (ALLEGRA) 60 MG tablet Take 1 tablet by mouth daily.    . fluconazole (DIFLUCAN) 150 MG tablet 1 tab po x 1, repeat in 72 hours 2 tablet 8  . fluticasone (FLONASE) 50 MCG/ACT nasal spray 1 spray by Each Nare route daily.    . hydrochlorothiazide (HYDRODIURIL) 25 MG tablet Take 1 tablet (25 mg total) by mouth daily. 90 tablet 3  . hydrOXYzine (ATARAX/VISTARIL) 25 MG tablet Take by mouth as needed.    Marland Kitchen liothyronine (CYTOMEL) 5 MCG tablet Take 1 tablet (5 mcg total) by mouth daily. 90 tablet 3  . Magnesium 500 MG CAPS Take by mouth.    . medroxyPROGESTERone (PROVERA) 5 MG tablet Take 1 tablet (5 mg total) by mouth daily. 90 tablet 4  . Multiple Vitamins-Minerals (MULTIVITAMIN PO) Take by mouth.    . Omega-3 Fatty Acids (FISH OIL PO) Take by mouth.    . potassium chloride (K-DUR) 10 MEQ tablet Take 10 mEq by mouth daily.  3  . pregabalin (LYRICA) 50 MG capsule Take 50 mg by mouth at bedtime.     Marland Kitchen SYNTHROID 100 MCG tablet Take 1 tablet (100 mcg total) by mouth daily before breakfast. 90 tablet 3  . vitamin B-12 (CYANOCOBALAMIN) 1000 MCG tablet Take by mouth.     No current facility-administered medications  on file prior to visit.   Allergies  Allergen Reactions  . Famotidine     Affects calcium  . Macrobid [Nitrofurantoin] Nausea Only  . Nsaids   . Sulfa Antibiotics Nausea Only  . Prednisone Rash   Family History  Problem Relation Age of Onset  . Hypertension Mother   . Stroke Mother   . Thyroid disease Father        Hyper  . Osteoporosis Father   . Stroke Father        x2  . Diabetes Brother     PE: BP 130/72 (BP Location: Left Arm, Patient Position: Sitting, Cuff Size: Small)   Pulse 76   Ht 5' 5.75" (1.67 m)   Wt 122 lb 4 oz (55.5 kg)   LMP 12/22/2014 (Exact Date)   SpO2 98%   BMI 19.88 kg/m  Wt Readings from Last 3 Encounters:  08/02/19 122 lb 4 oz (55.5 kg)  04/18/19 123 lb 6.4 oz (56 kg)  12/07/18 122 lb 6.4 oz (55.5 kg)   Constitutional: normal weight, in NAD Eyes: PERRLA, EOMI, no exophthalmos ENT: moist mucous membranes, no thyromegaly, no cervical lymphadenopathy Cardiovascular: RRR, No MRG Respiratory: CTA B Gastrointestinal: abdomen soft, NT, ND, BS+ Musculoskeletal: no deformities, strength intact in all 4 Skin: moist, warm, no rashes Neurological: no tremor with outstretched hands, DTR normal in all 4  ASSESSMENT: 1. Postsurgical hypothyroidism  2. Postsurgical hypoparathyroidism  PLAN:  1. Patient with longstanding, postsurgical, hypothyroidism, developed after her thyroidectomy in 1997 (benign pathology).  She is on LT4+ LT3.  The equivalent total dose of thyroid hormones = 120 mcg levothyroxine .  She has tried to come off liothyronine in the past but she developed severe fatigue and we started.  She developed abdominal pain on generic levothyroxine - latest thyroid labs reviewed with pt >> normal: Lab Results  Component Value Date  TSH 1.00 07/20/2018   - she continues on Synthroid d.a.w. 100 mcg daily + liothyronine 5 mcg daily - pt feels good on this dose, without complaints - we discussed about taking the thyroid hormone every day, with  water, >30 minutes before breakfast, separated by >4 hours from acid reflux medications, calcium, iron, multivitamins. Pt. is taking it correctly. - will check thyroid tests today: TSH and fT4 - If labs are abnormal, she will need to return for repeat TFTs in 1.5 months  2. Postsurgical hypoparathyroidism -Fair control on the current regimen (please see HPI) -Ionized calcium from last visit was slightly low, but still close to target.  Of note, previous 24-hour urine calcium was normal -No perioral numbness or hand cramping.  She continues to have occasional leg cramps, which she attributes to low potassium. -We will continue her current regimen for now -We will check an ionized calcium and a vitamin D level today  Orders Placed This Encounter  Procedures  . TSH  . T4, free  . T3, free  . VITAMIN D 25 Hydroxy (Vit-D Deficiency, Fractures)  . Calcium, ionized   Needs refills: thyroid meds, HCTZ, Calcitriol.  Component     Latest Ref Rng & Units 08/02/2019  TSH     0.35 - 4.50 uIU/mL 0.61  T4,Free(Direct)     0.60 - 1.60 ng/dL 4.17  Triiodothyronine,Free,Serum     2.3 - 4.2 pg/mL 3.6  Calcium Ionized     4.8 - 5.6 mg/dL 4.5 (L)  Vitamin D, 40-CXKGYJE     30.0 - 100.0 ng/mL 72.0  Ionized calcium is slightly low, but still at goal for hypoparathyroidism. OTW, normal labs.  Carlus Pavlov, MD PhD [facility] Endocrinology

## 2019-08-03 LAB — CALCIUM, IONIZED: Calcium, Ion: 4.5 mg/dL — ABNORMAL LOW (ref 4.8–5.6)

## 2019-08-03 LAB — VITAMIN D 25 HYDROXY (VIT D DEFICIENCY, FRACTURES): Vit D, 25-Hydroxy: 72 ng/mL (ref 30.0–100.0)

## 2019-08-04 MED ORDER — LIOTHYRONINE SODIUM 5 MCG PO TABS: 5.0000 ug | ORAL_TABLET | Freq: Every day | ORAL | 3 refills | Status: DC

## 2019-08-04 MED ORDER — HYDROCHLOROTHIAZIDE 25 MG PO TABS: 25.0000 mg | ORAL_TABLET | Freq: Every day | ORAL | 3 refills | Status: DC

## 2019-08-04 MED ORDER — SYNTHROID 100 MCG PO TABS: 100.0000 ug | ORAL_TABLET | Freq: Every day | ORAL | 3 refills | Status: DC

## 2019-09-13 ENCOUNTER — Telehealth: Payer: Self-pay

## 2019-09-13 NOTE — Telephone Encounter (Signed)
Patient notified that bone density is normal. Follow up in 67yrs.

## 2019-09-21 DIAGNOSIS — R944 Abnormal results of kidney function studies: Secondary | ICD-10-CM | POA: Insufficient documentation

## 2019-12-11 ENCOUNTER — Other Ambulatory Visit: Payer: Self-pay | Admitting: Endocrinology

## 2020-01-11 ENCOUNTER — Other Ambulatory Visit: Payer: Self-pay | Admitting: Endocrinology

## 2020-01-11 NOTE — Telephone Encounter (Signed)
For you

## 2020-02-05 ENCOUNTER — Other Ambulatory Visit: Payer: Self-pay | Admitting: Obstetrics & Gynecology

## 2020-02-15 IMAGING — CT CT CHEST HIGH RESOLUTION W/O CM
2 of 5 series · 15 of 36 positions shown, 18 images · non-contrast
Comparison: 09/07/2018 chest radiograph.

CLINICAL DATA: Chronic cough and dyspnea since [REDACTED]. Concern
for interstitial lung disease.

EXAM:
CT CHEST WITHOUT CONTRAST
TECHNIQUE: Multidetector CT imaging of the chest was performed following the
standard protocol without intravenous contrast. High resolution
imaging of the lungs, as well as inspiratory and expiratory imaging,
was performed.

[Series 2: high resolution · axial · 0.57mm/px · z∈[-316,-24]mm · 12 of 162 slices shown, 15 images]
[im 8/162  mediastinal]
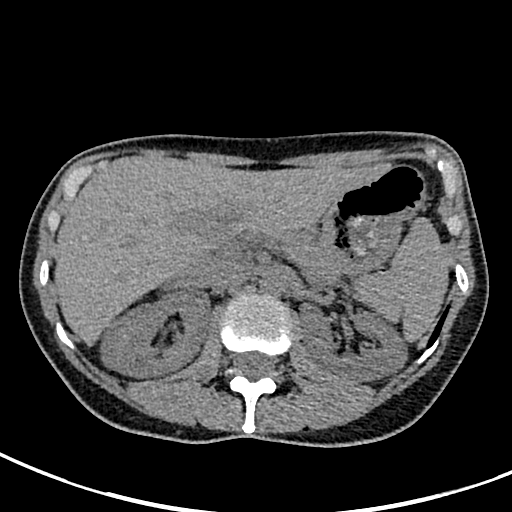
[im 8/162  lung]
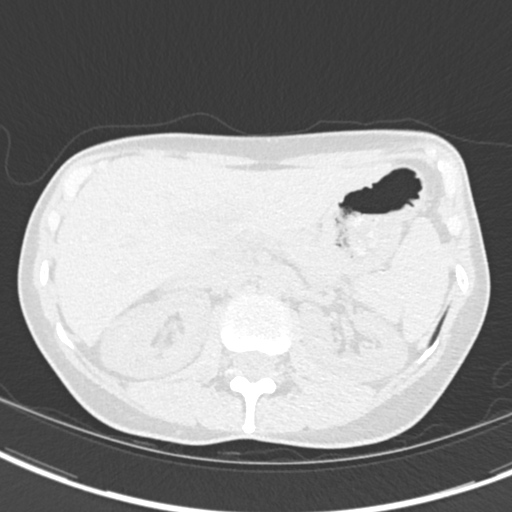
[im 24/162  lung]
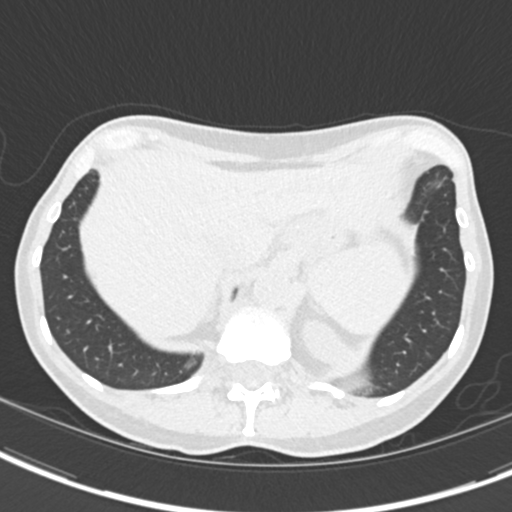
[im 39/162  lung]
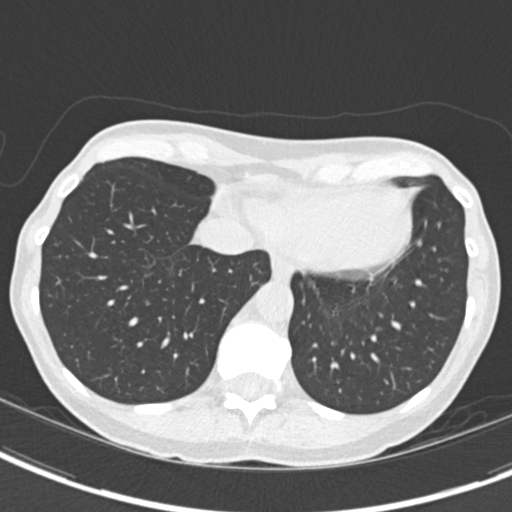
[im 47/162  lung]
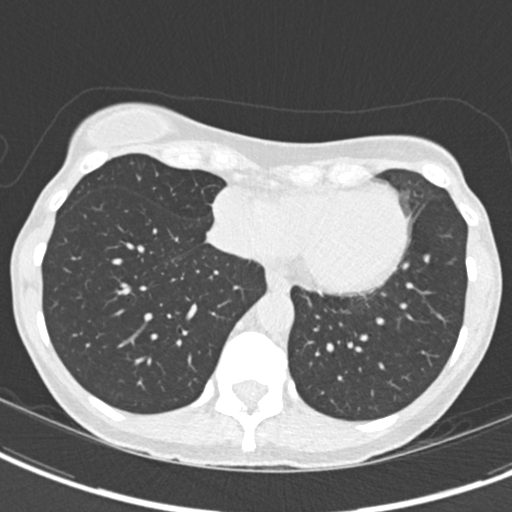
[im 62/162  mediastinal]
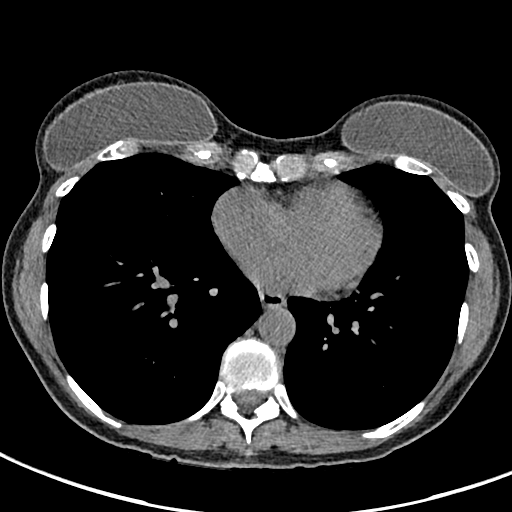
[im 62/162  lung]
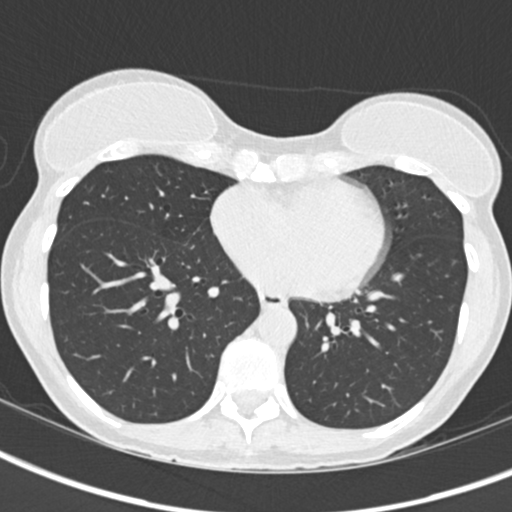
[im 77/162  lung]
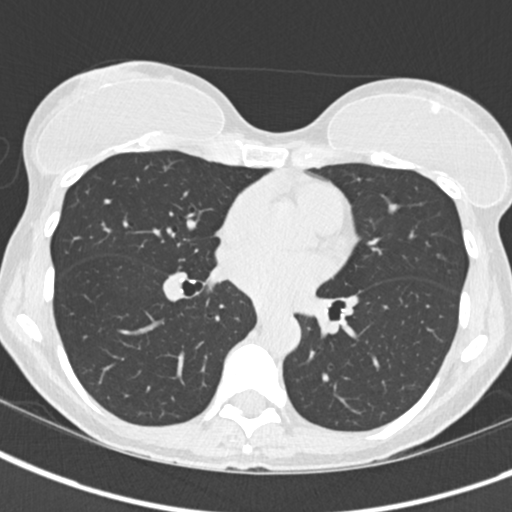
[im 85/162  lung]
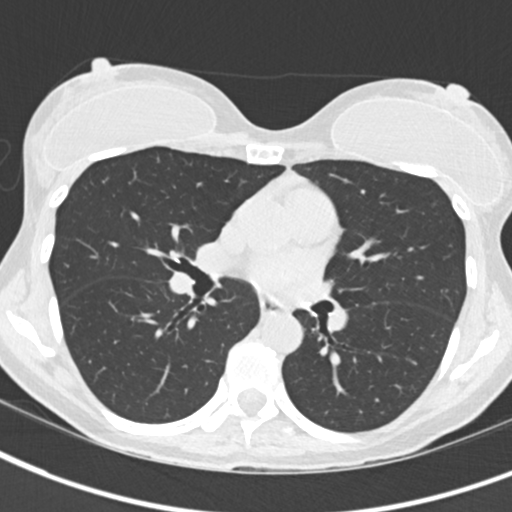
[im 100/162  lung]
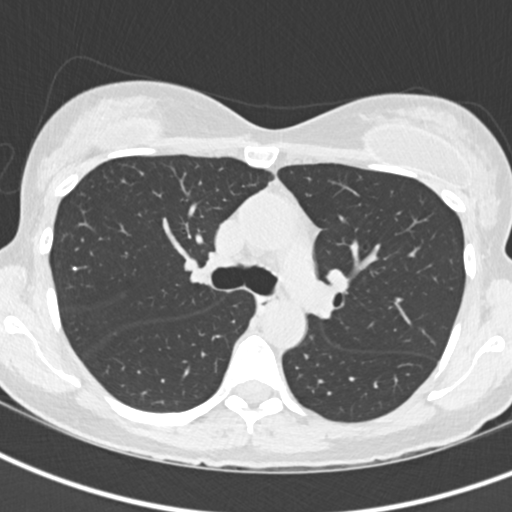
[im 116/162  mediastinal]
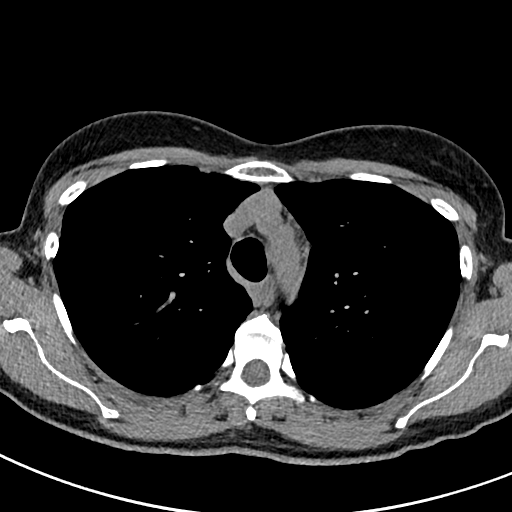
[im 116/162  lung]
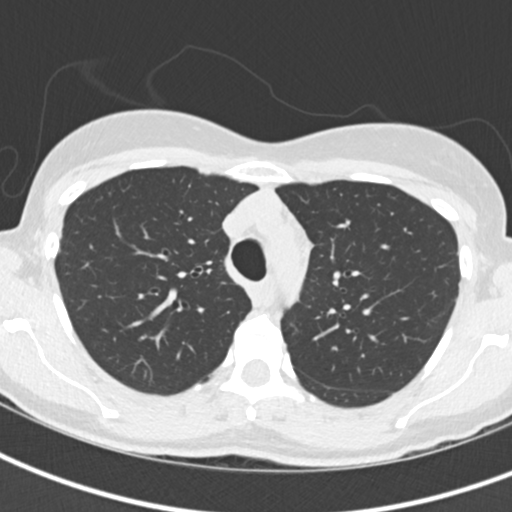
[im 123/162  lung]
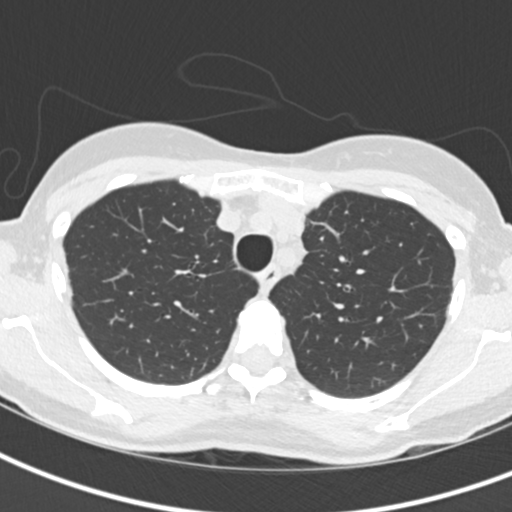
[im 139/162  lung]
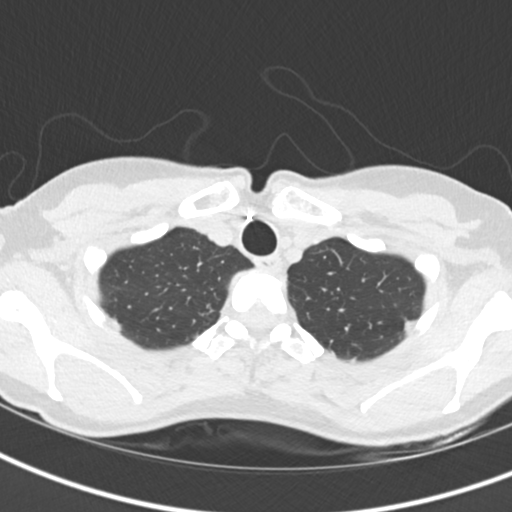
[im 154/162  lung]
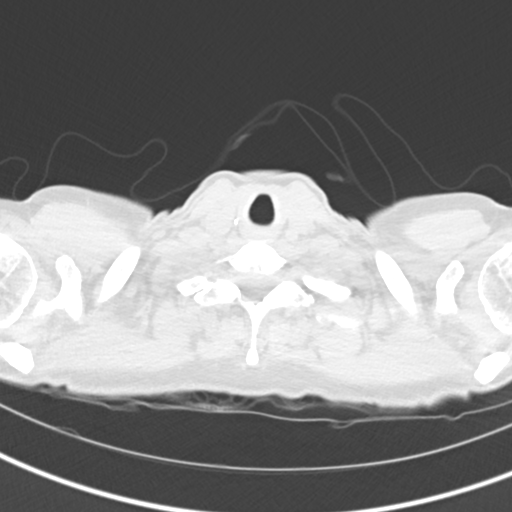

[Series 7: coronal · coronal · 0.56mm/px · 3 of 101 slices shown]
[im 21/101  lung]
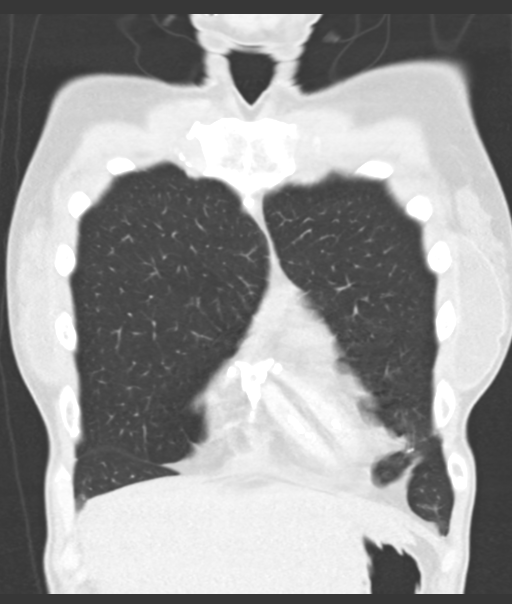
[im 41/101  lung]
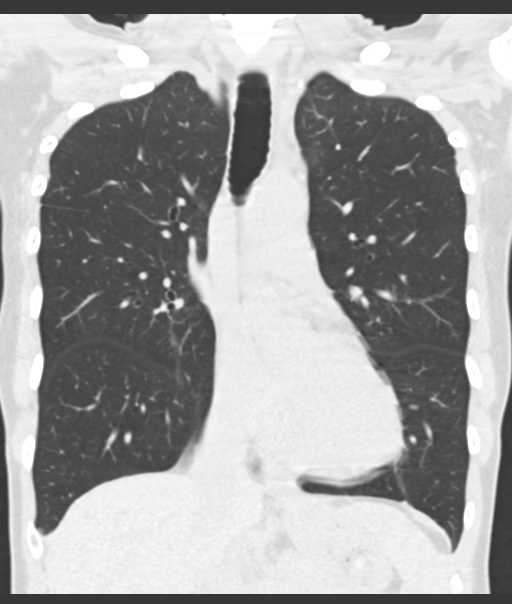
[im 61/101  lung]
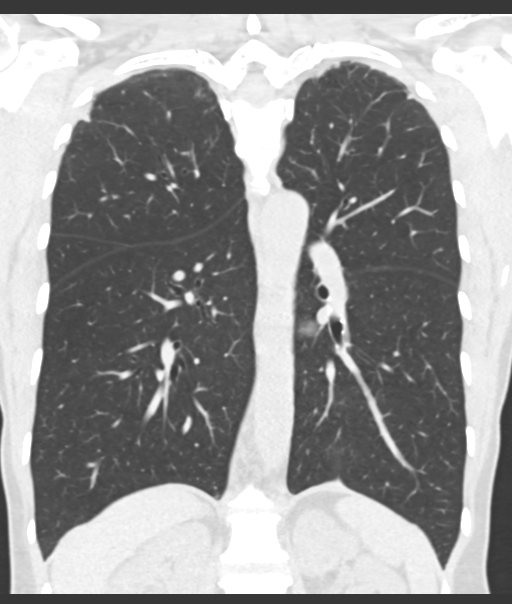

[15 of 36 positions shown; findings below may reference images not displayed]

FINDINGS: Cardiovascular: Normal heart size. No significant pericardial
effusion/thickening. Minimally atherosclerotic nonaneurysmal
thoracic aorta. Normal caliber pulmonary arteries.

Mediastinum/Nodes: Total thyroidectomy. Unremarkable esophagus. No
pathologically enlarged axillary, mediastinal or hilar lymph nodes,
noting limited sensitivity for the detection of hilar adenopathy on
this noncontrast study.

Lungs/Pleura: No pneumothorax. No pleural effusion. No acute
consolidative airspace disease, lung masses or significant pulmonary
nodules. Tiny calcified anterior right lower lobe granuloma along
the major fissure. Symmetric subpleural bandlike patchy
consolidation at both lung apices, compatible with nonspecific mild
biapical pleural-parenchymal scarring. Otherwise, no significant
regions of subpleural reticulation, ground-glass attenuation,
traction bronchiectasis, architectural distortion or frank
honeycombing. No significant lobular air trapping or evidence of
tracheobronchomalacia on the expiration sequence.

Upper abdomen: No acute abnormality.

Musculoskeletal: No aggressive appearing focal osseous lesions.
Bilateral breast prostheses. Mild thoracic spondylosis.
IMPRESSION: 1. No evidence of interstitial lung disease. No active pulmonary
disease.
2. Symmetric mild biapical pleural-parenchymal scarring compatible
with either senescent or postinfectious change.

Aortic Atherosclerosis ([id number]-CAA.A).

## 2020-02-17 ENCOUNTER — Telehealth: Payer: Self-pay | Admitting: Obstetrics & Gynecology

## 2020-02-17 ENCOUNTER — Other Ambulatory Visit: Payer: Self-pay | Admitting: Obstetrics & Gynecology

## 2020-02-17 MED ORDER — CLINDAMYCIN PHOSPHATE 2 % VA CREA: TOPICAL_CREAM | VAGINAL | 0 refills | Status: DC

## 2020-02-17 NOTE — Telephone Encounter (Signed)
Done. Thank you.

## 2020-04-10 ENCOUNTER — Other Ambulatory Visit: Payer: Self-pay | Admitting: Obstetrics and Gynecology

## 2020-04-10 DIAGNOSIS — B009 Herpesviral infection, unspecified: Secondary | ICD-10-CM

## 2020-04-10 NOTE — Telephone Encounter (Signed)
Call placed to patient regarding refill request received for acyclovir.  Last AEX w/ Dr. Hyacinth Meeker 04/18/19.  Next AEX w/ Dr. Hyacinth Meeker 07/10/20.  Patient unaware refill request was sent to [facility]. Patient will contact [facility]/ Dr. Hyacinth Meeker for refill.  Patient thankful for call.   Encounter closed.

## 2020-04-13 ENCOUNTER — Encounter: Payer: Self-pay | Admitting: Internal Medicine

## 2020-06-24 ENCOUNTER — Other Ambulatory Visit (HOSPITAL_BASED_OUTPATIENT_CLINIC_OR_DEPARTMENT_OTHER): Payer: Self-pay

## 2020-06-24 DIAGNOSIS — N952 Postmenopausal atrophic vaginitis: Secondary | ICD-10-CM

## 2020-06-24 MED ORDER — CLINDAMYCIN PHOSPHATE 2 % VA CREA: TOPICAL_CREAM | VAGINAL | 0 refills | Status: DC

## 2020-06-24 MED ORDER — ESTRADIOL 1 MG PO TABS: 1.0000 mg | ORAL_TABLET | Freq: Every day | ORAL | 0 refills | Status: DC

## 2020-07-10 ENCOUNTER — Ambulatory Visit (HOSPITAL_BASED_OUTPATIENT_CLINIC_OR_DEPARTMENT_OTHER): Payer: Managed Care, Other (non HMO) | Admitting: Obstetrics & Gynecology

## 2020-07-24 ENCOUNTER — Ambulatory Visit: Payer: Managed Care, Other (non HMO)

## 2020-07-28 ENCOUNTER — Other Ambulatory Visit (HOSPITAL_COMMUNITY)
Admission: RE | Admit: 2020-07-28 | Discharge: 2020-07-28 | Disposition: A | Discharge status: Home / Self Care | Payer: Managed Care, Other (non HMO) | Source: Ambulatory Visit | Attending: Obstetrics & Gynecology | Admitting: Obstetrics & Gynecology

## 2020-07-28 ENCOUNTER — Encounter (HOSPITAL_BASED_OUTPATIENT_CLINIC_OR_DEPARTMENT_OTHER): Payer: Self-pay | Admitting: Obstetrics & Gynecology

## 2020-07-28 ENCOUNTER — Other Ambulatory Visit: Payer: Self-pay

## 2020-07-28 ENCOUNTER — Ambulatory Visit (INDEPENDENT_AMBULATORY_CARE_PROVIDER_SITE_OTHER): Payer: Managed Care, Other (non HMO) | Admitting: Obstetrics & Gynecology

## 2020-07-28 VITALS — BP 142/87 | HR 85 | Ht 64.5 in | Wt 120.4 lb

## 2020-07-28 DIAGNOSIS — Z124 Encounter for screening for malignant neoplasm of cervix: Secondary | ICD-10-CM | POA: Insufficient documentation

## 2020-07-28 DIAGNOSIS — N952 Postmenopausal atrophic vaginitis: Secondary | ICD-10-CM

## 2020-07-28 DIAGNOSIS — N760 Acute vaginitis: Secondary | ICD-10-CM

## 2020-07-28 DIAGNOSIS — B001 Herpesviral vesicular dermatitis: Secondary | ICD-10-CM

## 2020-07-28 DIAGNOSIS — N898 Other specified noninflammatory disorders of vagina: Secondary | ICD-10-CM

## 2020-07-28 DIAGNOSIS — R7989 Other specified abnormal findings of blood chemistry: Secondary | ICD-10-CM | POA: Diagnosis not present

## 2020-07-28 DIAGNOSIS — Z79890 Hormone replacement therapy (postmenopausal): Secondary | ICD-10-CM

## 2020-07-28 DIAGNOSIS — N951 Menopausal and female climacteric states: Secondary | ICD-10-CM

## 2020-07-28 DIAGNOSIS — Z01419 Encounter for gynecological examination (general) (routine) without abnormal findings: Secondary | ICD-10-CM | POA: Diagnosis not present

## 2020-07-28 MED ORDER — FLUCONAZOLE 150 MG PO TABS: ORAL_TABLET | ORAL | 5 refills | Status: DC

## 2020-07-28 MED ORDER — CLINDAMYCIN PHOSPHATE 2 % VA CREA: TOPICAL_CREAM | VAGINAL | 5 refills | Status: DC

## 2020-07-28 MED ORDER — MEDROXYPROGESTERONE ACETATE 5 MG PO TABS: 5.0000 mg | ORAL_TABLET | Freq: Every day | ORAL | 4 refills | Status: DC

## 2020-07-28 MED ORDER — ESTRADIOL 10 MCG VA TABS
1.0000 | ORAL_TABLET | VAGINAL | 3 refills | Status: DC
Start: 2020-07-30 — End: 2021-08-20

## 2020-07-28 MED ORDER — ESTRADIOL 1 MG PO TABS: 1.0000 mg | ORAL_TABLET | Freq: Every day | ORAL | 4 refills | Status: DC

## 2020-07-28 NOTE — Progress Notes (Signed)
60 y.o. G19P2002 Married White or Caucasian female here for annual exam.  Had elevated creatinine with lab work last year.  Saw Dr. Franne Forts.  HCTZ was stopped and switched to calcium channel blocker.  Pt has hypoparathyroidism.  Calcium level decreased and she having leg aches and leg muscle cramping.  She went back on 1/2 tab HCTZ by herself.  She felt the nephrology appt wasn't very helpful.  Would like second opinion.    Denies vaginal bleeding.    Patient's last menstrual period was 12/22/2014 (exact date).          Sexually active: Yes.    The current method of family planning is post menopausal status.    Exercising: no Smoker:  no  Health Maintenance: Pap:  02/05/16 History of abnormal Pap:  yes MMG:  10/02/2019 Normal Colonoscopy:  cologuard 04/2019.  Follow up 2024.   BMD:   order will be faxed to do this year TDaP:  pt is sure this is up to date Pneumonia vaccine(s):  n/a Shingrix:   declined Hep C testing: 2017 Screening Labs: Dr. Shary Decamp   reports that she has never smoked. She has never used smokeless tobacco. She reports current alcohol use of about 2.0 standard drinks of alcohol per week. She reports that she does not use drugs.  Past Medical History:  Diagnosis Date   Abnormal Pap smear of cervix    Hx of colposcopy but no treatment--paps normal since   Chronic back pain    Chronic vulvitis and desquamative vaginitis    bx benign hyperplasia   HSV-1 infection    Hypocalcemia    IBS (irritable bowel syndrome)    PVC (premature ventricular contraction) 2004   Thyroid disease    Hypothyroidism, Hypoparathyroidism   Yeast infection    Chronic    Past Surgical History:  Procedure Laterality Date   BASAL CELL CARCINOMA EXCISION Left 05/2013   BREAST ENHANCEMENT SURGERY  07/2007   Saline   COLPOSCOPY     28 yrs ago   DIAGNOSTIC LAPAROSCOPY  1988   HNP Re-Repair  1/10  2009   thumb surgery Left 1989   THYROIDECTOMY  10/1995   due to thyroid nodule     Current Outpatient Medications  Medication Sig Dispense Refill   acyclovir (ZOVIRAX) 400 MG tablet Take 1 tablet (400 mg total) by mouth 2 (two) times daily. 180 tablet 3   aspirin EC 81 MG tablet Take 81 mg by mouth daily.     BORIC ACID EX Apply topically as needed.     calcitRIOL (ROCALTROL) 0.25 MCG capsule TAKE 1 CAPSULE DAILY 90 capsule 3   desonide (DESOWEN) 0.05 % ointment Apply topically as needed. 60 g 1   estradiol (ESTRACE) 1 MG tablet Take 1 tablet (1 mg total) by mouth daily. 90 tablet 0   fluconazole (DIFLUCAN) 150 MG tablet 1 tab po x 1, repeat in 72 hours 2 tablet 8   fluticasone (FLONASE) 50 MCG/ACT nasal spray 1 spray by Each Nare route daily.     hydrochlorothiazide (HYDRODIURIL) 25 MG tablet TAKE 1 TABLET DAILY 90 tablet 3   hydrOXYzine (ATARAX/VISTARIL) 25 MG tablet Take by mouth as needed.     liothyronine (CYTOMEL) 5 MCG tablet Take 1 tablet (5 mcg total) by mouth daily. 90 tablet 3   Magnesium 500 MG CAPS Take by mouth.     medroxyPROGESTERone (PROVERA) 5 MG tablet Take 1 tablet (5 mg total) by mouth daily. 90 tablet 4  Multiple Vitamins-Minerals (MULTIVITAMIN PO) Take by mouth.     Omega-3 Fatty Acids (FISH OIL PO) Take by mouth.     potassium chloride (K-DUR) 10 MEQ tablet Take 10 mEq by mouth daily.  3   pregabalin (LYRICA) 50 MG capsule Take 50 mg by mouth at bedtime.      SYNTHROID 100 MCG tablet Take 1 tablet (100 mcg total) by mouth daily before breakfast. 90 tablet 3   vitamin B-12 (CYANOCOBALAMIN) 1000 MCG tablet Take by mouth.     calcium carbonate (OSCAL) 1500 (600 Ca) MG TABS tablet Take 600 mg of elemental calcium by mouth 2 (two) times daily with a meal.     clindamycin (CLEOCIN) 2 % vaginal cream USE AT BEDTIME FOR 5 DAYS  AS NEEDED (Patient not taking: Reported on 07/28/2020) 40 g 0   cyclobenzaprine (FLEXERIL) 5 MG tablet Take 5 mg by mouth as needed. (Patient not taking: Reported on 07/28/2020)     diazepam (VALIUM) 5 MG tablet Take 5 mg by mouth  as needed for muscle spasms.  (Patient not taking: Reported on 07/28/2020)     fexofenadine (ALLEGRA) 60 MG tablet Take 1 tablet by mouth daily. (Patient not taking: Reported on 07/28/2020)     No current facility-administered medications for this visit.    Family History  Problem Relation Age of Onset   Hypertension Mother    Stroke Mother    Thyroid disease Father        Hyper   Osteoporosis Father    Stroke Father        x2   Diabetes Brother     Review of Systems  All other systems reviewed and are negative.  Exam:   BP (!) 142/87 (BP Location: Left Arm, Patient Position: Sitting, Cuff Size: Small)   Pulse 85   Ht 5' 4.5" (1.638 m)   Wt 120 lb 6.4 oz (54.6 kg)   LMP 12/22/2014 (Exact Date)   BMI 20.35 kg/m   Height: 5' 4.5" (163.8 cm)  General appearance: alert, cooperative and appears stated age Head: Normocephalic, without obvious abnormality, atraumatic Neck: no adenopathy, supple, symmetrical, trachea midline and thyroid normal to inspection and palpation Lungs: clear to auscultation bilaterally Breasts: normal appearance, no masses or tenderness Heart: regular rate and rhythm Abdomen: soft, non-tender; bowel sounds normal; no masses,  no organomegaly Extremities: extremities normal, atraumatic, no cyanosis or edema Skin: Skin color, texture, turgor normal. No rashes or lesions Lymph nodes: Cervical, supraclavicular, and axillary nodes normal. No abnormal inguinal nodes palpated Neurologic: Grossly normal   Pelvic: External genitalia:  no lesions              Urethra:  normal appearing urethra with no masses, tenderness or lesions              Bartholins and Skenes: normal                 Vagina: normal appearing vagina with normal color and no discharge, no lesions              Cervix: no lesions              Pap taken: Yes.   Bimanual Exam:  Uterus:  normal size, contour, position, consistency, mobility, non-tender              Adnexa: normal adnexa and no  mass, fullness, tenderness               Rectovaginal: Confirms  Anus:  normal sphincter tone, no lesions  Chaperone, Ina Homes, CMA, was present for exam.  Assessment/Plan: 1. Well woman exam with routine gynecological exam - pap and HR HPV obtained today - MMG up to date - cologuard negative 2021  2. Vaginal irritation  3. Elevated serum creatinine - Ambulatory referral to Nephrology  4. Post-menopausal atrophic vaginitis  5. Menopausal syndrome on hormone replacement therapy - estradiol (ESTRACE) 1 MG tablet; Take 1 tablet (1 mg total) by mouth daily.  Dispense: 90 tablet; Refill: 4 - medroxyPROGESTERone (PROVERA) 5 MG tablet; Take 1 tablet (5 mg total) by mouth daily.  Dispense: 90 tablet; Refill: 4 - pt is going to start taking 1/2 tab each and give update  6. Fever blister - when needs refills, will consider changing to valtrex  7. Recurrent vaginitis - Estradiol (VAGIFEM) 10 MCG TABS vaginal tablet; Place 1 tablet (10 mcg total) vaginally 2 (two) times a week.  Dispense: 24 tablet; Refill: 3 - fluconazole (DIFLUCAN) 150 MG tablet; 1 tab po x 1, repeat in 72 hours  Dispense: 2 tablet; Refill: 5 - clindamycin (CLEOCIN) 2 % vaginal cream; USE AT BEDTIME FOR 5 DAYS  AS NEEDED  Dispense: 40 g; Refill: 5

## 2020-07-30 LAB — CYTOLOGY - PAP
Comment: NEGATIVE
Diagnosis: NEGATIVE
High risk HPV: NEGATIVE

## 2020-08-07 ENCOUNTER — Encounter: Payer: Self-pay | Admitting: Internal Medicine

## 2020-08-07 ENCOUNTER — Ambulatory Visit: Payer: Managed Care, Other (non HMO) | Admitting: Internal Medicine

## 2020-08-07 ENCOUNTER — Other Ambulatory Visit: Payer: Self-pay

## 2020-08-07 ENCOUNTER — Other Ambulatory Visit (INDEPENDENT_AMBULATORY_CARE_PROVIDER_SITE_OTHER): Payer: Managed Care, Other (non HMO)

## 2020-08-07 VITALS — BP 118/80 | HR 78 | Ht 64.5 in | Wt 121.4 lb

## 2020-08-07 DIAGNOSIS — E892 Postprocedural hypoparathyroidism: Secondary | ICD-10-CM | POA: Diagnosis not present

## 2020-08-07 DIAGNOSIS — E890 Postprocedural hypothyroidism: Secondary | ICD-10-CM

## 2020-08-07 LAB — BASIC METABOLIC PANEL
BUN: 16 mg/dL (ref 6–23)
CO2: 27 mEq/L (ref 19–32)
Calcium: 9.2 mg/dL (ref 8.4–10.5)
Chloride: 103 mEq/L (ref 96–112)
Creatinine, Ser: 0.98 mg/dL (ref 0.40–1.20)
GFR: 62.89 mL/min (ref >60.00)
Glucose, Bld: 91 mg/dL (ref 70–99)
Potassium: 4.2 mEq/L (ref 3.5–5.1)
Sodium: 138 mEq/L (ref 135–145)

## 2020-08-07 LAB — TSH: TSH: 0.74 u[IU]/mL (ref 0.35–5.50)

## 2020-08-07 LAB — T4, FREE: Free T4: 1.11 ng/dL (ref 0.60–1.60)

## 2020-08-07 LAB — PHOSPHORUS: Phosphorus: 4.1 mg/dL (ref 2.3–4.6)

## 2020-08-07 LAB — T3, FREE: T3, Free: 2.9 pg/mL (ref 2.3–4.2)

## 2020-08-07 NOTE — Patient Instructions (Signed)
Please stop at [facility] lab.  Continue Synthroid 100 mcg + liothyronine 5 mcg daily.  Take the thyroid hormone every day, with water, at least 30 minutes before breakfast, separated by at least 4 hours from: - acid reflux medications - calcium - iron - multivitamins  Please come back for a follow-up appointment in 1 year.

## 2020-08-07 NOTE — Progress Notes (Signed)
Patient ID: Tabitha Clark, female   DOB: 08/27/1960, 60 y.o.   MRN: [id number]   This visit occurred during the SARS-CoV-2 public health emergency.  Safety protocols were in place, including screening questions prior to the visit, additional usage of staff PPE, and extensive cleaning of exam room while observing appropriate contact time as indicated for disinfecting solutions.   HPI  Tabitha Clark is a 60 y.o.-year-old very pleasant female, initially referred by Ria Comment, FNP, returning for follow-up for postsurgical hypothyroidism and hypoparathyroidism, dx'ed in 1997 after thyroidectomy for a thyroid nodule (turned out to be benign).  Last visit 1 year ago.  Interim history: Since last OV, she stopped HCTZ due to decrease kidney fxn.  Now on Olive leaf extract - for BP control. Last week, ObGyn halved her HRT.  She does still be more fatigued since then. She also has occasional muscle cramps.  Postsurgical hypothyroidism  She is on Synthroid d.a.w. 100 mcg + liothyronine generic 5 mcg daily.   She tried generic levothyroxine developed abdominal pain.   Also, she had PVCs when she tried to decrease her levothyroxine dose (!). She also needs to have liothyronine, otherwise, she has extreme fatigue.  Pt takes the thyroid hormones: - in am - fasting - at least 30 min-1h  from b'fast - + Ca, MVI 1 to 1.5 hours after levothyroxine (she does this every day) - no Fe, PPIs - not on Biotin  Reviewed patient's TFTs: Lab Results  Component Value Date   TSH 0.61 08/02/2019   TSH 1.00 07/20/2018   TSH 1.080 01/12/2018   TSH 1.57 07/21/2017   TSH 0.90 01/13/2017   FREET4 1.29 08/02/2019   FREET4 0.97 07/20/2018   FREET4 1.58 01/12/2018   FREET4 0.97 07/21/2017   FREET4 1.11 01/13/2017  06/24/2016: TSH 0.853 04/11/2016: TSH 0.407 06/29/2015: TSH 0.03, free t4: 1.06, free t3 2.58 (LT4 dose decreased to 100 mcg daily) TPO Abs (03/07/2014): 37 (<34).  She stopped OCPs in 2016  and had diet change (eliminated gluten and dairy) >> lost weight.  Pt denies: - feeling nodules in neck - hoarseness - dysphagia - choking - SOB with lying down  She has + FH of thyroid disorders in: father >> Htyr. No FH of thyroid cancer. No h/o radiation tx to head or neck.  No herbal supplements. No Biotin use. No recent steroids use (allergic to steroids).   Postsurgical hypoparathyroidism.  She continues on: - vitamin D + Ca (Caltrate): 400 units + 600 mg - on Ca 2x a day - Multivitamin: 200 mg calcium + ? Vitamin D - calcitriol 0.25 mcg daily with breakfast - Magnesium 500 mg daily Off HCTZ 25  mg in a.m. >> she was tolerating this well, without dehydration, but with occasional dizziness (possibly from Mnire's disease, reportedly).  She has slightly low calcium levels, which are actually at goal for postsurgical hypocalcemia:   Component     Latest Ref Rng & Units 08/02/2019  Calcium Ionized     4.8 - 5.6 mg/dL 4.5 (L)   Component     Latest Ref Rng & Units 07/20/2018  Calcium Ionized     4.8 - 5.6 mg/dL 4.09 (L)   Component     Latest Ref Rng & Units 01/12/2018  Calcium Ionized     4.8 - 5.6 mg/dL 5.1   Lab Results  Component Value Date   CALCIUM 8.4 (L) 07/21/2017  04/01/2016: Ca 9.0 06/29/2015: Ca 10.5, PTH <1  Magnesium and  phosphorus normal: Lab Results  Component Value Date   MG 1.8 01/13/2017   PHOS 3.6 01/13/2017   Vitamin D levels normal: Lab Results  Component Value Date   VD25OH 72.0 08/02/2019   VD25OH 52.44 07/21/2017   VD25OH 55.53 01/13/2017    Her 24-hour urine calcium levels were normal: Component     Latest Ref Rng & Units 02/10/2017  Calcium, 24H Urine     mg/24 h 235  Creatinine, 24H Ur     0.50 - 2.15 g/24 h 1.34  08/14/2013: 137 06/11/2012: 132  She saw nephrology since last OV. Latest kidney fxn available: 01/28/2020: 16/0.87, GFR 77 Lab Results  Component Value Date   BUN 17 07/21/2017   Lab Results  Component  Value Date   CREATININE 0.92 07/21/2017   She may get hypocalcemic symptoms if she gets dehydrated. Has occasional tingling hands >> resolved by taking Tums.  She is also on potassium 10 mEq daily for muscle cramps.   She had shortness of breath and during investigation she was found to have interstitial lung disease in 09/2018.  This is slightly better.  She was discharged from the pulmonology clinic with indications return as needed.  She also had a lot of stress around the time that her mother died in 01/07/19.  ROS: + See HPI  I reviewed pt's medications, allergies, PMH, social hx, family hx, and changes were documented in the history of present illness. Otherwise, unchanged from my initial visit note.  Past Medical History:  Diagnosis Date   Abnormal Pap smear of cervix    Hx of colposcopy but no treatment--paps normal since   Chronic back pain    Chronic vulvitis and desquamative vaginitis    bx benign hyperplasia   HSV-1 infection    Hypocalcemia    IBS (irritable bowel syndrome)    PVC (premature ventricular contraction) 2004   Thyroid disease    Hypothyroidism, Hypoparathyroidism   Yeast infection    Chronic   Past Surgical History:  Procedure Laterality Date   BASAL CELL CARCINOMA EXCISION Left 05/2013   BREAST ENHANCEMENT SURGERY  07/2007   Saline   COLPOSCOPY     28 yrs ago   DIAGNOSTIC LAPAROSCOPY  1988   HNP Re-Repair  1/10  2009   thumb surgery Left 1989   THYROIDECTOMY  10/1995   due to thyroid nodule  L4-L5 laser Sx 2009 and 2010 L radial nerve repair 1989  Social History   Social History   Marital status: Married    Spouse name: N/A   Number of children: 2   Occupational History   NP Women's [employer]   Social History Main Topics   Smoking status: Never Smoker   Smokeless tobacco: Never Used   Alcohol use          Comment: 1-2 glasses of wine every mo   Drug use: No   Sexual activity: Yes    Partners: Male    Birth control/ protection:  Post-menopausal   Social History Narrative   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 180 tablet 3   aspirin EC 81 MG tablet Take 81 mg by mouth daily.     BORIC ACID EX Apply topically as needed.     calcitRIOL (  ROCALTROL) 0.25 MCG capsule TAKE 1 CAPSULE DAILY 90 capsule 3   calcium carbonate (OSCAL) 1500 (600 Ca) MG TABS tablet Take 600 mg of elemental calcium by mouth 2 (two) times daily with a meal.     clindamycin (CLEOCIN) 2 % vaginal cream USE AT BEDTIME FOR 5 DAYS  AS NEEDED 40 g 5   cyclobenzaprine (FLEXERIL) 5 MG tablet Take 5 mg by mouth as needed. (Patient not taking: Reported on 07/28/2020)     desonide (DESOWEN) 0.05 % ointment Apply topically as needed. 60 g 1   diazepam (VALIUM) 5 MG tablet Take 5 mg by mouth as needed for muscle spasms.  (Patient not taking: Reported on 07/28/2020)     estradiol (ESTRACE) 1 MG tablet Take 1 tablet (1 mg total) by mouth daily. 90 tablet 4   Estradiol (VAGIFEM) 10 MCG TABS vaginal tablet Place 1 tablet (10 mcg total) vaginally 2 (two) times a week. 24 tablet 3   fexofenadine (ALLEGRA) 60 MG tablet Take 1 tablet by mouth daily. (Patient not taking: Reported on 07/28/2020)     fluconazole (DIFLUCAN) 150 MG tablet 1 tab po x 1, repeat in 72 hours 2 tablet 5   fluticasone (FLONASE) 50 MCG/ACT nasal spray 1 spray by Each Nare route daily.     hydrochlorothiazide (HYDRODIURIL) 25 MG tablet TAKE 1 TABLET DAILY 90 tablet 3   hydrOXYzine (ATARAX/VISTARIL) 25 MG tablet Take by mouth as needed.     liothyronine (CYTOMEL) 5 MCG tablet Take 1 tablet (5 mcg total) by mouth daily. 90 tablet 3   Magnesium 500 MG CAPS Take by mouth.     medroxyPROGESTERone (PROVERA) 5 MG tablet Take 1 tablet (5 mg total) by mouth daily. 90 tablet 4   Multiple  Vitamins-Minerals (MULTIVITAMIN PO) Take by mouth.     Omega-3 Fatty Acids (FISH OIL PO) Take by mouth.     potassium chloride (K-DUR) 10 MEQ tablet Take 10 mEq by mouth daily.  3   pregabalin (LYRICA) 50 MG capsule Take 50 mg by mouth at bedtime.      SYNTHROID 100 MCG tablet Take 1 tablet (100 mcg total) by mouth daily before breakfast. 90 tablet 3   vitamin B-12 (CYANOCOBALAMIN) 1000 MCG tablet Take by mouth.     No current facility-administered medications on file prior to visit.   Allergies  Allergen Reactions   Famotidine     Affects calcium   Honey Bee Venom [Bee Venom] Other (See Comments)    Feels like has skipped heart beats   Macrobid [Nitrofurantoin] Nausea Only   Nsaids    Sulfa Antibiotics Nausea Only   Prednisone Rash   Family History  Problem Relation Age of Onset   Hypertension Mother    Stroke Mother    Thyroid disease Father        Hyper   Osteoporosis Father    Stroke Father        x2   Diabetes Brother     PE: BP 118/80 (BP Location: Right Arm, Patient Position: Sitting, Cuff Size: Normal)   Pulse 78   Ht 5' 4.5" (1.638 m)   Wt 121 lb 6.4 oz (55.1 kg)   LMP 12/22/2014 (Exact Date)   SpO2 99%   BMI 20.52 kg/m  Wt Readings from Last 3 Encounters:  08/07/20 121 lb 6.4 oz (55.1 kg)  07/28/20 120 lb 6.4 oz (54.6 kg)  08/02/19 122 lb 4 oz (55.5 kg)   Constitutional: normal weight, in NAD Eyes: PERRLA,  EOMI, no exophthalmos ENT: moist mucous membranes, no neck masses, no cervical lymphadenopathy Cardiovascular: RRR, No MRG Respiratory: CTA B Gastrointestinal: abdomen soft, NT, ND, BS+ Musculoskeletal: no deformities, strength intact in all 4 Skin: moist, warm, no rashes Neurological: no tremor with outstretched hands, DTR normal in all 4  ASSESSMENT: 1. Postsurgical hypothyroidism  2. Postsurgical hypoparathyroidism  PLAN:  1. Patient with longstanding, postsurgical, hypothyroidism, developed after her thyroidectomy 1997 (benign pathology).   She is on LT4+LT3 with the equivalent total dose of thyroid hormones of 120 mcg LT 4.  She has tried to come off liothyronine in the past but she developed severe fatigue and we had to restart it.  She had abdominal pain on generic levothyroxine. - latest thyroid labs reviewed with pt. >> normal: Lab Results  Component Value Date   TSH 0.61 08/02/2019  - she continues on Synthroid d.a.w. 100 mcg + liothyronine 5 mcg daily - pt feels good on this dose but feels more fatigued, which she wonders whether could be related to having her HRT doses - we discussed about taking the thyroid hormone every day, with water, >30 minutes before breakfast, separated by >4 hours from acid reflux medications, calcium, iron, multivitamins. Pt. is taking it correctly. - will check thyroid tests today: TSH, free T3 and fT4 - If labs are abnormal, she will need to return for repeat TFTs in 1.5 months  2. Postsurgical hypoparathyroidism -Shared control on the current regimen (please see HPI).  Since last visit, she stopped HCTZ due to decreased GFR.  Latest GFR available is 77.  She saw nephrology since last visit, but they did not suggest further evaluation and advised her to return as needed. -Ionized calcium levels remain slightly low but close to target.  Of note, previous 24-hour urine calcium was normal repeatedly, latest in 2019. -No perioral numbness or hand cramping.  She continues to have occasional leg cramps, which she feels may also be related to low potassium.  She is taking a potassium supplement. -We will continue current regimen for now -Most recent calcium level was normal on 01/28/2020, at 8.6 (8.5-10.5) -We will check an ionized calcium, phosphorus, calcitriol, and vitamin D level today  No orders of the defined types were placed in this encounter.  Needs refills: thyroid meds, HCTZ, Calcitriol.  Component     Latest Ref Rng & Units 08/07/2020  Sodium     135 - 145 mEq/L 138  Potassium     3.5 -  5.1 mEq/L 4.2  Chloride     96 - 112 mEq/L 103  CO2     19 - 32 mEq/L 27  Glucose     70 - 99 mg/dL 91  BUN     6 - 23 mg/dL 16  Creatinine     1.610.40 - 1.20 mg/dL 0.960.98  GFR     >04.54>60.00 mL/min 62.89  Calcium     8.4 - 10.5 mg/dL 9.2  Phosphorus     2.3 - 4.6 mg/dL 4.1  TSH     0.980.35 - 1.195.50 uIU/mL 0.74  T4,Free(Direct)     0.60 - 1.60 ng/dL 1.471.11  Triiodothyronine,Free,Serum     2.3 - 4.2 pg/mL 2.9  Calcium Ionized     4.8 - 5.6 mg/dL 8.294.85  Labs are normal.  Calcitriol level is still in progress.  I do not absolutely feel that we need to restart HCTZ for now.  Carlus Pavlovristina Damita Eppard, MD PhD [facility]

## 2020-08-12 MED ORDER — SYNTHROID 100 MCG PO TABS: 100.0000 ug | ORAL_TABLET | Freq: Every day | ORAL | 3 refills | Status: DC

## 2020-08-12 MED ORDER — CALCITRIOL 0.25 MCG PO CAPS: 0.2500 ug | ORAL_CAPSULE | Freq: Every day | ORAL | 3 refills | Status: DC

## 2020-08-12 MED ORDER — LIOTHYRONINE SODIUM 5 MCG PO TABS
5.0000 ug | ORAL_TABLET | Freq: Every day | ORAL | 3 refills | Status: DC
Start: 2020-08-12 — End: 2020-08-14

## 2020-08-13 ENCOUNTER — Encounter: Payer: Self-pay | Admitting: Internal Medicine

## 2020-08-13 DIAGNOSIS — E890 Postprocedural hypothyroidism: Secondary | ICD-10-CM

## 2020-08-13 LAB — CALCIUM, IONIZED: Calcium, Ion: 4.85 mg/dL (ref 4.8–5.6)

## 2020-08-13 LAB — VITAMIN D 1,25 DIHYDROXY
Vitamin D 1, 25 (OH)2 Total: 28 pg/mL (ref 18–72)
Vitamin D2 1, 25 (OH)2: <8 pg/mL
Vitamin D3 1, 25 (OH)2: 28 pg/mL

## 2020-08-14 MED ORDER — CALCITRIOL 0.25 MCG PO CAPS: 0.2500 ug | ORAL_CAPSULE | Freq: Every day | ORAL | 3 refills | Status: DC

## 2020-08-14 MED ORDER — LIOTHYRONINE SODIUM 5 MCG PO TABS: 5.0000 ug | ORAL_TABLET | Freq: Every day | ORAL | 3 refills | Status: DC

## 2020-08-14 MED ORDER — SYNTHROID 100 MCG PO TABS: 100.0000 ug | ORAL_TABLET | Freq: Every day | ORAL | 3 refills | Status: DC

## 2020-10-02 ENCOUNTER — Telehealth: Payer: Self-pay | Admitting: Internal Medicine

## 2020-10-02 DIAGNOSIS — E890 Postprocedural hypothyroidism: Secondary | ICD-10-CM

## 2020-10-02 MED ORDER — SYNTHROID 100 MCG PO TABS
100.0000 ug | ORAL_TABLET | Freq: Every day | ORAL | 0 refills | Status: DC
Start: 2020-10-02 — End: 2020-11-05

## 2020-10-02 MED ORDER — LIOTHYRONINE SODIUM 5 MCG PO TABS: 5.0000 ug | ORAL_TABLET | Freq: Every day | ORAL | 0 refills | Status: DC

## 2020-10-02 NOTE — Telephone Encounter (Signed)
Medication sent.

## 2020-10-02 NOTE — Telephone Encounter (Signed)
Pt [place] at the beach and she has left her medication at home and was wondering if three pills can be called in for her since she is going to be there over the weekend.  SYNTHROID 100 MCG tablet  liothyronine (CYTOMEL) 5 MCG tablet   [facility]  [address] [place], [place] [address]

## 2020-10-07 ENCOUNTER — Encounter (HOSPITAL_BASED_OUTPATIENT_CLINIC_OR_DEPARTMENT_OTHER): Payer: Self-pay | Admitting: Obstetrics & Gynecology

## 2020-10-21 ENCOUNTER — Encounter (HOSPITAL_BASED_OUTPATIENT_CLINIC_OR_DEPARTMENT_OTHER): Payer: Self-pay | Admitting: Obstetrics & Gynecology

## 2020-11-05 ENCOUNTER — Other Ambulatory Visit: Payer: Self-pay | Admitting: Internal Medicine

## 2020-11-05 DIAGNOSIS — E890 Postprocedural hypothyroidism: Secondary | ICD-10-CM

## 2021-03-31 DIAGNOSIS — B002 Herpesviral gingivostomatitis and pharyngotonsillitis: Secondary | ICD-10-CM | POA: Insufficient documentation

## 2021-05-13 ENCOUNTER — Other Ambulatory Visit (HOSPITAL_BASED_OUTPATIENT_CLINIC_OR_DEPARTMENT_OTHER): Payer: Self-pay | Admitting: Obstetrics & Gynecology

## 2021-05-13 DIAGNOSIS — N951 Menopausal and female climacteric states: Secondary | ICD-10-CM

## 2021-05-13 DIAGNOSIS — N952 Postmenopausal atrophic vaginitis: Secondary | ICD-10-CM

## 2021-05-13 DIAGNOSIS — N760 Acute vaginitis: Secondary | ICD-10-CM

## 2021-08-06 ENCOUNTER — Encounter: Payer: Self-pay | Admitting: Internal Medicine

## 2021-08-06 ENCOUNTER — Ambulatory Visit: Payer: BC Managed Care – PPO | Admitting: Internal Medicine

## 2021-08-06 VITALS — BP 100/60 | HR 78 | Ht 64.5 in | Wt 120.2 lb

## 2021-08-06 DIAGNOSIS — E892 Postprocedural hypoparathyroidism: Secondary | ICD-10-CM

## 2021-08-06 DIAGNOSIS — E890 Postprocedural hypothyroidism: Secondary | ICD-10-CM

## 2021-08-06 LAB — VITAMIN D 25 HYDROXY (VIT D DEFICIENCY, FRACTURES): VITD: 66.39 ng/mL (ref 30.00–100.00)

## 2021-08-06 LAB — MAGNESIUM: Magnesium: 2.2 mg/dL (ref 1.5–2.5)

## 2021-08-06 LAB — T4, FREE: Free T4: 1.12 ng/dL (ref 0.60–1.60)

## 2021-08-06 LAB — T3, FREE: T3, Free: 3.3 pg/mL (ref 2.3–4.2)

## 2021-08-06 LAB — TSH: TSH: 0.26 u[IU]/mL — ABNORMAL LOW (ref 0.35–5.50)

## 2021-08-06 LAB — PHOSPHORUS: Phosphorus: 3.4 mg/dL (ref 2.3–4.6)

## 2021-08-06 NOTE — Progress Notes (Unsigned)
Patient ID: Tabitha Clark, female   DOB: [birth year], 61 y.o.   MRN: [id number]   HPI  Tabitha Clark is a 61 y.o.-year-old very pleasant female, initially referred by Ria Comment, FNP, returning for follow-up for postsurgical hypothyroidism and hypoparathyroidism, dx'ed in 1997 after thyroidectomy for a thyroid nodule (turned out to be benign).  Last visit 1 year ago.  Interim history: Before last OV, she stopped HCTZ due to decrease kidney fxn.  She started Olive leaf extract - for BP control.  She remains off HCTZ. Before last visit, OB/GYN [place] her HRT dose and she had more fatigue.  She continues to have fatigue, despite sleeping fairly well at night.  She has occasional hot flashes, approximately 3 times a week, not very bothersome She also has occasional muscle cramps - on Mg, K, occasional Tums at night  No tingling or hand cramping. Father has dementia and he lives with her, and she also works 4 days a week. Saw nephrology 11/2020 for decreased GFR >> normal evaluation, no further investigation needed.  Postsurgical hypothyroidism  She is on Synthroid d.a.w. 100 mcg + liothyronine generic 5 mcg daily.   She tried generic levothyroxine developed abdominal pain.   Also, she had PVCs when she tried to decrease her levothyroxine dose (!). She also needs to have liothyronine, otherwise, she has extreme fatigue.  Pt takes the thyroid hormones: - in am - fasting - at least 30 min-1h  from b'fast - + Ca, MVI 1 to 1.5 hours after levothyroxine (she does this every day) - no Fe, PPIs - not on Biotin  Reviewed patient's TFTs: Lab Results  Component Value Date   TSH 0.74 08/07/2020   TSH 0.61 08/02/2019   TSH 1.00 07/20/2018   TSH 1.080 01/12/2018   TSH 1.57 07/21/2017   TSH 0.90 01/13/2017   FREET4 1.11 08/07/2020   FREET4 1.29 08/02/2019   FREET4 0.97 07/20/2018   FREET4 1.58 01/12/2018   FREET4 0.97 07/21/2017   FREET4 1.11 01/13/2017  06/24/2016: TSH  0.853 04/11/2016: TSH 0.407 06/29/2015: TSH 0.03, free t4: 1.06, free t3 2.58 (LT4 dose decreased to 100 mcg daily) TPO Abs (03/07/2014): 37 (<34).  She stopped OCPs in 2016 and had diet change (eliminated gluten and dairy) >> lost weight.  Pt denies: - feeling nodules in neck - hoarseness - dysphagia - choking  She has + FH of thyroid disorders in: father >> Htyr. No FH of thyroid cancer. No h/o radiation tx to head or neck. No herbal supplements. No Biotin use. No recent steroids use (allergic to steroids) except Flonase.   Postsurgical hypoparathyroidism.  She continues on: - vitamin D + Ca (Caltrate): 400 units + 600 mg - on Ca 2x a day - Multivitamin: 200 mg calcium + ? Vitamin D - calcitriol 0.25 mcg daily with breakfast - Magnesium 500 mg daily Off HCTZ 25  mg in a.m. >> she was tolerating this well, without dehydration, but with occasional dizziness (possibly from Mnire's disease, reportedly).  However, she stopped this in 2022.  She has slightly low calcium levels, which are actually at goal for postsurgical hypocalcemia.  At last visit, ionized calcium was normal:  Component     Latest Ref Rng 08/07/2020  Calcium Ionized     4.8 - 5.6 mg/dL 6.44    Component     Latest Ref Rng & Units 08/02/2019  Calcium Ionized     4.8 - 5.6 mg/dL 4.5 (L)   Component  Latest Ref Rng & Units 07/20/2018  Calcium Ionized     4.8 - 5.6 mg/dL 1.03 (L)   Component     Latest Ref Rng & Units 01/12/2018  Calcium Ionized     4.8 - 5.6 mg/dL 5.1   Lab Results  Component Value Date   CALCIUM 9.2 08/07/2020  04/01/2016: Ca 9.0 06/29/2015: Ca 10.5, PTH <1  Magnesium and phosphorus normal: Lab Results  Component Value Date   MG 1.8 01/13/2017   PHOS 4.1 08/07/2020   PHOS 3.6 01/13/2017   Vitamin D levels normal: Lab Results  Component Value Date   VD25OH 72.0 08/02/2019   VD25OH 52.44 07/21/2017   VD25OH 55.53 01/13/2017    Her 24-hour urine calcium levels were  normal: Component     Latest Ref Rng & Units 02/10/2017  Calcium, 24H Urine     mg/24 h 235  Creatinine, 24H Ur     0.50 - 2.15 g/24 h 1.34  08/14/2013: 137 06/11/2012: 132  She saw nephrology since last OV. Latest kidney fxn available: Lab Results  Component Value Date   BUN 16 08/07/2020   BUN 17 07/21/2017   Lab Results  Component Value Date   CREATININE 0.98 08/07/2020   She may get hypocalcemic symptoms if she gets dehydrated. Has occasional tingling hands >> resolved by taking Tums. She is also on potassium 10 mEq daily for muscle cramps.  She had shortness of breath and during investigation she was found to have interstitial lung disease in 09/2018.  This is slightly better.  She was discharged from the pulmonology clinic with indications return as needed.  She also had a lot of stress around the time that her mother died in 12/28/18.  ROS: + See HPI  I reviewed pt's medications, allergies, PMH, social hx, family hx, and changes were documented in the history of present illness. Otherwise, unchanged from my initial visit note.  Past Medical History:  Diagnosis Date   Abnormal Pap smear of cervix    Hx of colposcopy but no treatment--paps normal since   Chronic back pain    Chronic vulvitis and desquamative vaginitis    bx benign hyperplasia   HSV-1 infection    Hypocalcemia    IBS (irritable bowel syndrome)    PVC (premature ventricular contraction) 2004   Thyroid disease    Hypothyroidism, Hypoparathyroidism   Yeast infection    Chronic   Past Surgical History:  Procedure Laterality Date   BASAL CELL CARCINOMA EXCISION Left 05/2013   BREAST ENHANCEMENT SURGERY  07/2007   Saline   COLPOSCOPY     28 yrs ago   DIAGNOSTIC LAPAROSCOPY  1988   HNP Re-Repair  1/10  2009   thumb surgery Left 1989   THYROIDECTOMY  10/1995   due to thyroid nodule  L4-L5 laser Sx 2009 and 2010 L radial nerve repair 1989  Social History   Social History   Marital status:  Married    Spouse name: N/A   Number of children: 2   Occupational History   NP [employer]   Social History Main Topics   Smoking status: Never Smoker   Smokeless tobacco: Never Used   Alcohol use          Comment: 1-2 glasses of wine every mo   Drug use: No   Sexual activity: Yes    Partners: Male    Birth control/ protection: Post-menopausal   Social History Narrative   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 (Patient taking differently: Take 400 mg by mouth as needed.) 180 tablet 3   aspirin EC 81 MG tablet Take 81 mg by mouth daily.     BORIC ACID EX Apply topically as needed.     calcitRIOL (ROCALTROL) 0.25 MCG capsule Take 1 capsule (0.25 mcg total) by mouth daily. 90 capsule 3   calcium carbonate (OSCAL) 1500 (600 Ca) MG TABS tablet Take 600 mg of elemental calcium by mouth 2 (two) times daily with a meal.     clindamycin (CLEOCIN) 2 % vaginal cream USE VAGINALLY AT BEDTIME   FOR 5 DAYS AS NEEDED 40 g 0   cyclobenzaprine (FLEXERIL) 5 MG tablet Take 5 mg by mouth as needed.     desonide (DESOWEN) 0.05 % ointment Apply topically as needed. 60 g 1   diazepam (VALIUM) 5 MG tablet Take 5 mg by mouth as needed for muscle spasms.     estradiol (ESTRACE) 1 MG tablet TAKE 1 TABLET DAILY 90 tablet 0   Estradiol (VAGIFEM) 10 MCG TABS vaginal tablet Place 1 tablet (10 mcg total) vaginally 2 (two) times a week. 24 tablet 3   fexofenadine (ALLEGRA) 60 MG tablet Take 1 tablet by mouth daily.     fluconazole (DIFLUCAN) 150 MG tablet TAKE 1 TABLET ONCE, REPEAT IN 72 HOURS 2 tablet 0   fluticasone (FLONASE) 50 MCG/ACT nasal spray 1 spray by Each Nare route daily.     hydrochlorothiazide (HYDRODIURIL) 25 MG tablet TAKE 1 TABLET DAILY (Patient not taking: Reported on  08/07/2020) 90 tablet 3   hydrOXYzine (ATARAX/VISTARIL) 25 MG tablet Take by mouth as needed.     liothyronine (CYTOMEL) 5 MCG tablet TAKE 1 TABLET BY MOUTH EVERY DAY 5 tablet 0   Magnesium 500 MG CAPS Take by mouth.     medroxyPROGESTERone (PROVERA) 5 MG tablet TAKE 1 TABLET DAILY 90 tablet 0   Multiple Vitamins-Minerals (MULTIVITAMIN PO) Take by mouth.     Omega-3 Fatty Acids (FISH OIL PO) Take by mouth.     potassium chloride (K-DUR) 10 MEQ tablet Take 10 mEq by mouth daily.  3   pregabalin (LYRICA) 50 MG capsule Take 50 mg by mouth at bedtime.      SYNTHROID 100 MCG tablet TAKE 1 TABLET BY MOUTH DAILY BEFORE BREAKFAST. 5 tablet 0   vitamin B-12 (CYANOCOBALAMIN) 1000 MCG tablet Take by mouth.     No current facility-administered medications on file prior to visit.   Allergies  Allergen Reactions   Famotidine     Affects calcium   Honey Bee Venom [Bee Venom] Other (See Comments)    Feels like has skipped heart beats   Macrobid [Nitrofurantoin] Nausea Only   Nsaids    Sulfa Antibiotics Nausea Only   Prednisone Rash   Family History  Problem Relation Age of Onset   Hypertension Mother    Stroke Mother    Thyroid disease Father        Hyper   Osteoporosis Father    Stroke Father        x2   Diabetes Brother     PE: BP 100/60 (BP Location: Left Arm, Patient Position: Sitting, Cuff Size: Normal)   Pulse 78   Ht 5' 4.5" (1.638 m)   [facility]  120 lb 3.2 oz (54.5 kg)   LMP 12/22/2014 (Exact Date)   SpO2 98%   BMI 20.31 kg/m  Wt Readings from Last 3 Encounters:  08/06/21 120 lb 3.2 oz (54.5 kg)  08/07/20 121 lb 6.4 oz (55.1 kg)  07/28/20 120 lb 6.4 oz (54.6 kg)   Constitutional: normal weight, in NAD Eyes: PERRLA, EOMI, no exophthalmos ENT: moist mucous membranes, no neck masses, no cervical lymphadenopathy Cardiovascular: RRR, No MRG Respiratory: CTA B Musculoskeletal: no deformities Skin: moist, warm, no rashes Neurological: no tremor with outstretched  hands  ASSESSMENT: 1. Postsurgical hypothyroidism  2. Postsurgical hypoparathyroidism  PLAN:  1. Patient with longstanding, postsurgical, hypothyroidism, developed after her thyroidectomy in 1997 (benign pathology).  She is on LT4+LT3 with the equivalent total dose of thyroid hormones of 120 mcg LT 4.  She has tried to come off liothyronine in the past but she developed severe fatigue and we had to restart it.  She had abdominal pain with generic levothyroxine. - latest thyroid labs reviewed with pt. 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  She plans to go a vacation for a week at the end of the month. - we discussed about taking the thyroid hormone every day, with water, >30 minutes before breakfast, separated by >4 hours from acid reflux medications, calcium, iron, multivitamins. Pt. is taking it correctly, except for taking calcium and vitamins close to levothyroxine, but she does this every morning. - will check thyroid tests today: TSH, free T3 and fT4 - If labs are abnormal, she will need to return for repeat TFTs in 1.5 months -Otherwise, we will see her back in a year  2. Postsurgical hypoparathyroidism -Well-controlled on the current regimen (see HPI).  -She denies perioral numbness or hand cramping.  She has occasional leg cramps, which she feels may be related to low potassium.  She is taking a potassium supplement.  She also takes a magnesium supplement.  We discussed that she may also try to drink tonic water before bedtime. -Before last visit, she stopped HCTZ due to the decreased GFR.  Afterwards, she saw nephrology, but no further investigation or intervention was needed. -She continues on calcitriol 0.25 mg daily -Ionized calcium levels were previously low but close to  target.  A 24-hour urine calcium was normal repeatedly, with the latest in 2019. -At last visit, ionized calcium, phosphorus, calcitriol levels were all normal.  We did not restart HCTZ. -At today's visit, we will check her ionized calcium, phosphorus, calcitriol, vitamin D  Needs refills - Prevo.  Component     Latest Ref Rng 08/06/2021  Triiodothyronine,Free,Serum     2.3 - 4.2 pg/mL 3.3   TSH     0.35 - 5.50 uIU/mL 0.26 (L)   T4,Free(Direct)     0.60 - 1.60 ng/dL 1.12   VITD     30.00 - 100.00 ng/mL 66.39   Phosphorus     2.3 - 4.6 mg/dL 3.4   Magnesium     1.5 - 2.5 mg/dL 2.2   Calcium Ionized     4.7 - 5.5 mg/dL 4.7   Vitamin D 1, 25 (OH) Total     18 - 72 pg/mL 43   Vitamin D3 1, 25 (OH)     pg/mL 43  Vitamin D2 1, 25 (OH)     pg/mL <8     TSH is slightly low.  I will advise her to decrease the dose of Synthroid to 88 mcg daily (continue liothyronine 5 mcg daily) and recheck her test again in 1.5 months.  The rest of the labs are all at goal.  Philemon Kingdom, MD PhD [facility] Endocrinology

## 2021-08-06 NOTE — Patient Instructions (Signed)
Please stop at the lab.  Continue Synthroid 100 mcg + liothyronine 5 mcg daily.  Take the thyroid hormone every day, with water, at least 30 minutes before breakfast, separated by at least 4 hours from: - acid reflux medications - calcium - iron - multivitamins  Please come back for a follow-up appointment in 1 year.

## 2021-08-10 ENCOUNTER — Encounter: Payer: Self-pay | Admitting: Internal Medicine

## 2021-08-10 LAB — VITAMIN D 1,25 DIHYDROXY
Vitamin D 1, 25 (OH)2 Total: 43 pg/mL (ref 18–72)
Vitamin D2 1, 25 (OH)2: <8 pg/mL
Vitamin D3 1, 25 (OH)2: 43 pg/mL

## 2021-08-10 LAB — CALCIUM, IONIZED: Calcium, Ion: 4.7 mg/dL (ref 4.7–5.5)

## 2021-08-10 MED ORDER — SYNTHROID 88 MCG PO TABS: 88.0000 ug | ORAL_TABLET | Freq: Every day | ORAL | 6 refills | Status: DC

## 2021-08-10 MED ORDER — LIOTHYRONINE SODIUM 5 MCG PO TABS: 5.0000 ug | ORAL_TABLET | Freq: Every day | ORAL | 5 refills | Status: DC

## 2021-08-12 ENCOUNTER — Encounter: Payer: Self-pay | Admitting: Internal Medicine

## 2021-08-12 DIAGNOSIS — E890 Postprocedural hypothyroidism: Secondary | ICD-10-CM

## 2021-08-16 MED ORDER — CALCITRIOL 0.25 MCG PO CAPS: 0.2500 ug | ORAL_CAPSULE | Freq: Every day | ORAL | 3 refills | Status: DC

## 2021-08-20 ENCOUNTER — Ambulatory Visit (INDEPENDENT_AMBULATORY_CARE_PROVIDER_SITE_OTHER): Payer: BC Managed Care – PPO | Admitting: Obstetrics & Gynecology

## 2021-08-20 ENCOUNTER — Encounter (HOSPITAL_BASED_OUTPATIENT_CLINIC_OR_DEPARTMENT_OTHER): Payer: Self-pay | Admitting: Obstetrics & Gynecology

## 2021-08-20 ENCOUNTER — Other Ambulatory Visit (HOSPITAL_COMMUNITY)
Admission: RE | Admit: 2021-08-20 | Discharge: 2021-08-20 | Disposition: A | Discharge status: Home / Self Care | Payer: BC Managed Care – PPO | Source: Ambulatory Visit | Attending: Obstetrics & Gynecology | Admitting: Obstetrics & Gynecology

## 2021-08-20 VITALS — BP 133/81 | HR 75 | Ht 64.5 in | Wt 120.8 lb

## 2021-08-20 DIAGNOSIS — B009 Herpesviral infection, unspecified: Secondary | ICD-10-CM | POA: Diagnosis not present

## 2021-08-20 DIAGNOSIS — Z01419 Encounter for gynecological examination (general) (routine) without abnormal findings: Secondary | ICD-10-CM | POA: Diagnosis not present

## 2021-08-20 DIAGNOSIS — N760 Acute vaginitis: Secondary | ICD-10-CM | POA: Diagnosis not present

## 2021-08-20 DIAGNOSIS — N952 Postmenopausal atrophic vaginitis: Secondary | ICD-10-CM | POA: Diagnosis not present

## 2021-08-20 DIAGNOSIS — N951 Menopausal and female climacteric states: Secondary | ICD-10-CM

## 2021-08-20 DIAGNOSIS — Z79890 Hormone replacement therapy (postmenopausal): Secondary | ICD-10-CM

## 2021-08-20 MED ORDER — ESTRADIOL 10 MCG VA TABS
1.0000 | ORAL_TABLET | VAGINAL | 3 refills | Status: DC
Start: 2021-08-23 — End: 2022-08-30

## 2021-08-20 MED ORDER — PROGESTERONE MICRONIZED 100 MG PO CAPS: 100.0000 mg | ORAL_CAPSULE | Freq: Every day | ORAL | 4 refills | Status: DC

## 2021-08-20 MED ORDER — ESTRADIOL 0.5 MG PO TABS: 1.0000 mg | ORAL_TABLET | Freq: Every day | ORAL | 4 refills | Status: DC

## 2021-08-20 MED ORDER — FLUCONAZOLE 150 MG PO TABS: 150.0000 mg | ORAL_TABLET | ORAL | 1 refills | Status: DC | PRN

## 2021-08-20 MED ORDER — DESONIDE 0.05 % EX OINT: TOPICAL_OINTMENT | CUTANEOUS | 3 refills | Status: DC

## 2021-08-20 MED ORDER — ACYCLOVIR 400 MG PO TABS: 400.0000 mg | ORAL_TABLET | Freq: Two times a day (BID) | ORAL | 3 refills | Status: DC

## 2021-08-20 MED ORDER — CLINDAMYCIN PHOSPHATE 2 % VA CREA: TOPICAL_CREAM | VAGINAL | 0 refills | Status: DC

## 2021-08-20 NOTE — Progress Notes (Unsigned)
61 y.o. G18P2002 Married White or Caucasian female here for annual exam.  Doing well.  Denies vaginal bleeding.    Had dental implant placed.  She was treated with amoxicillin.  Since May, has continued to have some burning.  Has taken diflucan.  Using vagifem as well.   Patient's last menstrual period was 12/22/2014 (exact date).          Sexually active: Yes.    The current method of family planning is post menopausal status.    Smoker:  no  Health Maintenance: Pap:  07/28/2020 Normal History of abnormal Pap:  remote hx of CIN1 around 1990 MMG:  10/16/2020 Negative Colonoscopy:  cologuard 2021 BMD:   09/06/2019 Normal Screening Labs: Dr. Shary Decamp   reports that she has never smoked. She has never used smokeless tobacco. She reports current alcohol use of about 2.0 standard drinks of alcohol per week. She reports that she does not use drugs.  Past Medical History:  Diagnosis Date   Abnormal Pap smear of cervix    Hx of colposcopy but no treatment--paps normal since   Chronic back pain    Chronic vulvitis and desquamative vaginitis    bx benign hyperplasia   HSV-1 infection    Hypocalcemia    IBS (irritable bowel syndrome)    PVC (premature ventricular contraction) 2004   Thyroid disease    Hypothyroidism, Hypoparathyroidism   Yeast infection    Chronic    Past Surgical History:  Procedure Laterality Date   BASAL CELL CARCINOMA EXCISION Left 05/2013   BREAST ENHANCEMENT SURGERY  07/2007   Saline   COLPOSCOPY     28 yrs ago   DIAGNOSTIC LAPAROSCOPY  1988   HNP Re-Repair  1/10  2009   thumb surgery Left 1989   THYROIDECTOMY  10/1995   due to thyroid nodule    Current Outpatient Medications  Medication Sig Dispense Refill   aspirin EC 81 MG tablet Take 81 mg by mouth daily.     BORIC ACID EX Apply topically as needed.     calcitRIOL (ROCALTROL) 0.25 MCG capsule Take 1 capsule (0.25 mcg total) by mouth daily. 90 capsule 3   calcium carbonate (OSCAL) 1500 (600 Ca) MG TABS  tablet Take 600 mg of elemental calcium by mouth 2 (two) times daily with a meal.     cyclobenzaprine (FLEXERIL) 5 MG tablet Take 5 mg by mouth as needed.     diazepam (VALIUM) 5 MG tablet Take 5 mg by mouth as needed for muscle spasms.     fexofenadine (ALLEGRA) 60 MG tablet Take 1 tablet by mouth daily.     fluconazole (DIFLUCAN) 150 MG tablet TAKE 1 TABLET ONCE, REPEAT IN 72 HOURS 2 tablet 0   fluticasone (FLONASE) 50 MCG/ACT nasal spray 1 spray by Each Nare route daily.     hydrOXYzine (ATARAX/VISTARIL) 25 MG tablet Take by mouth as needed.     liothyronine (CYTOMEL) 5 MCG tablet Take 1 tablet (5 mcg total) by mouth daily. 45 tablet 5   Magnesium 500 MG CAPS Take by mouth.     Multiple Vitamins-Minerals (MULTIVITAMIN PO) Take by mouth.     Olive Leaf Extract 150 MG CAPS Take by mouth.     Omega-3 Fatty Acids (FISH OIL PO) Take by mouth.     potassium chloride (K-DUR) 10 MEQ tablet Take 10 mEq by mouth daily.  3   pregabalin (LYRICA) 50 MG capsule Take 50 mg by mouth at bedtime.  progesterone (PROMETRIUM) 100 MG capsule Take 1 capsule (100 mg total) by mouth daily. 90 capsule 4   pyridOXINE (VITAMIN B6) 100 MG tablet Take 100 mg by mouth daily.     SYNTHROID 88 MCG tablet Take 1 tablet (88 mcg total) by mouth daily before breakfast. 45 tablet 6   vitamin B-12 (CYANOCOBALAMIN) 1000 MCG tablet Take by mouth.     acyclovir (ZOVIRAX) 400 MG tablet Take 1 tablet (400 mg total) by mouth 2 (two) times daily. 180 tablet 3   clindamycin (CLEOCIN) 2 % vaginal cream Apply 1 applicator vaginally once weekly. 40 g 0   desonide (DESOWEN) 0.05 % ointment Apply topically twice weekly as needed for irritation 60 g 3   estradiol (ESTRACE) 0.5 MG tablet Take 2 tablets (1 mg total) by mouth daily. 90 tablet 4   [START ON 08/23/2021] Estradiol (VAGIFEM) 10 MCG TABS vaginal tablet Place 1 tablet (10 mcg total) vaginally 2 (two) times a week. 24 tablet 3   No current facility-administered medications for this  visit.    Family History  Problem Relation Age of Onset   Hypertension Mother    Stroke Mother    Thyroid disease Father        Hyper   Osteoporosis Father    Stroke Father        x2   Diabetes Brother     ROS: Constitutional: negative Genitourinary:negative  Exam:   BP 133/81 (BP Location: Left Arm, Patient Position: Sitting, Cuff Size: Normal)   Pulse 75   Ht 5' 4.5" (1.638 m) Comment: Reported  Wt 120 lb 12.8 oz (54.8 kg)   LMP 12/22/2014 (Exact Date)   BMI 20.42 kg/m   Height: 5' 4.5" (163.8 cm) (Reported)  General appearance: alert, cooperative and appears stated age Head: Normocephalic, without obvious abnormality, atraumatic Neck: no adenopathy, supple, symmetrical, trachea midline and thyroid {EXAM; THYROID:18604} Lungs: clear to auscultation bilaterally Breasts: {Exam; breast:13139::"normal appearance, no masses or tenderness"} Heart: regular rate and rhythm Abdomen: soft, non-tender; bowel sounds normal; no masses,  no organomegaly Extremities: extremities normal, atraumatic, no cyanosis or edema Skin: Skin color, texture, turgor normal. No rashes or lesions Lymph nodes: Cervical, supraclavicular, and axillary nodes normal. No abnormal inguinal nodes palpated Neurologic: Grossly normal   Pelvic: External genitalia:  no lesions              Urethra:  normal appearing urethra with no masses, tenderness or lesions              Bartholins and Skenes: normal                 Vagina: normal appearing vagina with normal color and no discharge, no lesions              Cervix: {exam; cervix:14595}              Pap taken: {yes no:314532} Bimanual Exam:  Uterus:  {exam; uterus:12215}              Adnexa: {exam; adnexa:12223}               Rectovaginal: Confirms               Anus:  normal sphincter tone, no lesions  Chaperone, ***, CMA, was present for exam.  Assessment/Plan:

## 2021-08-23 LAB — CERVICOVAGINAL ANCILLARY ONLY
Bacterial Vaginitis (gardnerella): NEGATIVE
Candida Glabrata: NEGATIVE
Candida Vaginitis: NEGATIVE
Comment: NEGATIVE
Comment: NEGATIVE
Comment: NEGATIVE

## 2021-08-25 ENCOUNTER — Encounter (HOSPITAL_BASED_OUTPATIENT_CLINIC_OR_DEPARTMENT_OTHER): Payer: Self-pay | Admitting: Obstetrics & Gynecology

## 2021-09-07 ENCOUNTER — Other Ambulatory Visit (HOSPITAL_BASED_OUTPATIENT_CLINIC_OR_DEPARTMENT_OTHER): Payer: Self-pay | Admitting: Obstetrics & Gynecology

## 2021-09-07 ENCOUNTER — Other Ambulatory Visit (HOSPITAL_BASED_OUTPATIENT_CLINIC_OR_DEPARTMENT_OTHER): Payer: Self-pay

## 2021-09-07 MED ORDER — ESTRADIOL 0.1 MG/GM VA CREA: TOPICAL_CREAM | VAGINAL | 3 refills | Status: DC

## 2021-10-13 DIAGNOSIS — F419 Anxiety disorder, unspecified: Secondary | ICD-10-CM | POA: Diagnosis not present

## 2021-10-13 DIAGNOSIS — Z0000 Encounter for general adult medical examination without abnormal findings: Secondary | ICD-10-CM | POA: Diagnosis not present

## 2021-10-13 DIAGNOSIS — F5101 Primary insomnia: Secondary | ICD-10-CM | POA: Diagnosis not present

## 2021-10-13 DIAGNOSIS — Z13220 Encounter for screening for lipoid disorders: Secondary | ICD-10-CM | POA: Diagnosis not present

## 2021-10-13 DIAGNOSIS — Z1389 Encounter for screening for other disorder: Secondary | ICD-10-CM | POA: Diagnosis not present

## 2021-10-13 DIAGNOSIS — Z23 Encounter for immunization: Secondary | ICD-10-CM | POA: Diagnosis not present

## 2021-10-13 DIAGNOSIS — E892 Postprocedural hypoparathyroidism: Secondary | ICD-10-CM | POA: Diagnosis not present

## 2021-10-13 DIAGNOSIS — G609 Hereditary and idiopathic neuropathy, unspecified: Secondary | ICD-10-CM | POA: Diagnosis not present

## 2021-10-13 DIAGNOSIS — E538 Deficiency of other specified B group vitamins: Secondary | ICD-10-CM | POA: Diagnosis not present

## 2021-10-13 DIAGNOSIS — E039 Hypothyroidism, unspecified: Secondary | ICD-10-CM | POA: Diagnosis not present

## 2021-10-13 DIAGNOSIS — Z131 Encounter for screening for diabetes mellitus: Secondary | ICD-10-CM | POA: Diagnosis not present

## 2021-10-13 DIAGNOSIS — Z130 Encounter for screening for diseases of the blood and blood-forming organs and certain disorders involving the immune mechanism: Secondary | ICD-10-CM | POA: Diagnosis not present

## 2021-10-13 DIAGNOSIS — I700 Atherosclerosis of aorta: Secondary | ICD-10-CM | POA: Diagnosis not present

## 2021-10-13 DIAGNOSIS — Z13228 Encounter for screening for other metabolic disorders: Secondary | ICD-10-CM | POA: Diagnosis not present

## 2021-11-15 ENCOUNTER — Encounter: Payer: Self-pay | Admitting: Internal Medicine

## 2021-11-25 ENCOUNTER — Encounter (HOSPITAL_BASED_OUTPATIENT_CLINIC_OR_DEPARTMENT_OTHER): Payer: Self-pay | Admitting: *Deleted

## 2022-01-25 ENCOUNTER — Other Ambulatory Visit (HOSPITAL_BASED_OUTPATIENT_CLINIC_OR_DEPARTMENT_OTHER): Payer: Self-pay | Admitting: Obstetrics & Gynecology

## 2022-01-25 DIAGNOSIS — N760 Acute vaginitis: Secondary | ICD-10-CM

## 2022-02-04 ENCOUNTER — Other Ambulatory Visit (HOSPITAL_BASED_OUTPATIENT_CLINIC_OR_DEPARTMENT_OTHER): Payer: Self-pay | Admitting: *Deleted

## 2022-02-04 DIAGNOSIS — B009 Herpesviral infection, unspecified: Secondary | ICD-10-CM

## 2022-02-04 MED ORDER — ACYCLOVIR 400 MG PO TABS: 400.0000 mg | ORAL_TABLET | Freq: Two times a day (BID) | ORAL | 0 refills | Status: DC

## 2022-02-04 NOTE — Progress Notes (Signed)
Pharmacy requests refill on acyclovir. Rx sent.

## 2022-03-17 ENCOUNTER — Other Ambulatory Visit (HOSPITAL_BASED_OUTPATIENT_CLINIC_OR_DEPARTMENT_OTHER): Payer: Self-pay | Admitting: Obstetrics & Gynecology

## 2022-03-17 DIAGNOSIS — N760 Acute vaginitis: Secondary | ICD-10-CM

## 2022-05-15 ENCOUNTER — Other Ambulatory Visit: Payer: Self-pay | Admitting: Internal Medicine

## 2022-08-03 ENCOUNTER — Other Ambulatory Visit (HOSPITAL_BASED_OUTPATIENT_CLINIC_OR_DEPARTMENT_OTHER): Payer: Self-pay | Admitting: Obstetrics & Gynecology

## 2022-08-03 DIAGNOSIS — B009 Herpesviral infection, unspecified: Secondary | ICD-10-CM

## 2022-08-09 ENCOUNTER — Other Ambulatory Visit: Payer: Self-pay | Admitting: Internal Medicine

## 2022-08-12 ENCOUNTER — Ambulatory Visit: Payer: BC Managed Care – PPO | Admitting: Internal Medicine

## 2022-08-12 ENCOUNTER — Encounter: Payer: Self-pay | Admitting: Internal Medicine

## 2022-08-12 VITALS — BP 122/80 | HR 95 | Ht 64.5 in | Wt 122.6 lb

## 2022-08-12 DIAGNOSIS — E890 Postprocedural hypothyroidism: Secondary | ICD-10-CM | POA: Diagnosis not present

## 2022-08-12 DIAGNOSIS — E892 Postprocedural hypoparathyroidism: Secondary | ICD-10-CM | POA: Diagnosis not present

## 2022-08-12 MED ORDER — SYNTHROID 100 MCG PO TABS: 100.0000 ug | ORAL_TABLET | Freq: Every day | ORAL | 5 refills | Status: DC

## 2022-08-12 NOTE — Progress Notes (Signed)
Patient ID: Tabitha Clark, female   DOB: August 31, 1960, 62 y.o.   MRN: [id number]   HPI  Tabitha Clark is a 62 y.o.-year-old very pleasant female, initially referred by Tabitha Comment, FNP, returning for follow-up for postsurgical hypothyroidism and hypoparathyroidism, dx'ed in 1997 after thyroidectomy for a thyroid nodule (turned out to be benign).  Last visit 1 year ago.  Interim history: She continues to have fatigue. 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 She also works 4 days a week. She has problems concentrating. She sleeps well. She has gas - colonoscopy was normal recently. She also has occasional muscle cramps - on Mg, K, occasional Tums at night  No tingling or hand cramping.  Postsurgical hypothyroidism  She is on Synthroid d.a.w. 100 mcg + liothyronine generic 5 mcg daily.   She tried generic levothyroxine developed abdominal pain.   Also, she had PVCs when she tried to decrease her levothyroxine dose (!). She also needs to have liothyronine, otherwise, she has extreme fatigue.  Pt takes the thyroid hormones: - in am - fasting - at least 30 min-1h  from b'fast - + Ca, MVI >3 hours after levothyroxine (she does this every day) - no Fe, PPIs - not on Biotin  Reviewed patient's TFTs: Lab Results  Component Value Date   TSH 4.200 08/09/2022   TSH 0.26 (L) 08/06/2021   TSH 0.74 08/07/2020   TSH 0.61 08/02/2019   TSH 1.00 07/20/2018   TSH 1.080 01/12/2018   TSH 1.57 07/21/2017   TSH 0.90 01/13/2017   FREET4 1.12 08/06/2021   FREET4 1.11 08/07/2020   FREET4 1.29 08/02/2019   FREET4 0.97 07/20/2018   FREET4 1.58 01/12/2018   FREET4 0.97 07/21/2017   FREET4 1.11 01/13/2017  04/20/2022: TSH 4.4 10/13/2021: TSH 1.65 06/24/2016: TSH 0.853 04/11/2016: TSH 0.407 06/29/2015: TSH 0.03, free t4: 1.06, free t3 2.58 (LT4 dose decreased to 100 mcg daily) TPO Abs (03/07/2014): 37 (<34).  She stopped OCPs in 2016 and had diet change  (eliminated gluten and dairy) >> lost weight.  Pt denies: - feeling nodules in neck - hoarseness - dysphagia - choking  She has + FH of thyroid disorders in: father >> Htyr. No FH of thyroid cancer. No h/o radiation tx to head or neck. No herbal supplements. No Biotin use. No recent steroids use (allergic to steroids) except Flonase.   Postsurgical hypoparathyroidism.  She continues on: - vitamin D + Ca (Caltrate): 400 units + 600 mg - on Ca 2x a day - Multivitamin: 200 mg calcium + ? Vitamin D - calcitriol 0.25 mcg daily with breakfast - Magnesium 500 mg daily Off HCTZ 25  mg in a.m. >> she was tolerating this well, without dehydration, but with occasional dizziness (possibly from Mnire's disease, reportedly).  However, she stopped this in 2022.  She has slightly low calcium levels, which are actually at goal for postsurgical hypocalcemia.  At last visit, ionized calcium was normal:  Component     Latest Ref Rng 08/06/2021  Calcium Ionized     4.7 - 5.5 mg/dL 4.7   Vitamin D 1, 25 (OH) Total     18 - 72 pg/mL 43   Vitamin D3 1, 25 (OH)     pg/mL 43   Vitamin D2 1, 25 (OH)     pg/mL <8    Component     Latest Ref Rng 08/07/2020  Calcium Ionized     4.8 - 5.6 mg/dL  4.85    Component     Latest Ref Rng & Units 08/02/2019  Calcium Ionized     4.8 - 5.6 mg/dL 4.5 (L)   Component     Latest Ref Rng & Units 07/20/2018  Calcium Ionized     4.8 - 5.6 mg/dL 3.66 (L)   Component     Latest Ref Rng & Units 01/12/2018  Calcium Ionized     4.8 - 5.6 mg/dL 5.1   44/03/4740: calcium 8.9 Lab Results  Component Value Date   CALCIUM 9.2 08/07/2020  04/01/2016: Ca 9.0 06/29/2015: Ca 10.5, PTH <1  Magnesium and phosphorus normal: Lab Results  Component Value Date   MG 2.2 08/06/2021   MG 1.8 01/13/2017   PHOS 3.4 08/06/2021   PHOS 4.1 08/07/2020   PHOS 3.6 01/13/2017   Vitamin D levels normal: Lab Results  Component Value Date   VD25OH 66.39 08/06/2021   VD25OH 72.0  08/02/2019   VD25OH 52.44 07/21/2017   VD25OH 55.53 01/13/2017    Her 24-hour urine calcium levels were normal: Component     Latest Ref Rng & Units 02/10/2017  Calcium, 24H Urine     mg/24 h 235  Creatinine, 24H Ur     0.50 - 2.15 g/24 h 1.34  08/14/2013: 137 06/11/2012: 132  She saw nephrology since last OV. Latest kidney fxn available: Lab Results  Component Value Date   BUN 16 08/07/2020   BUN 17 07/21/2017   Lab Results  Component Value Date   CREATININE 0.98 08/07/2020   She may get hypocalcemic symptoms if she gets dehydrated. Has occasional tingling hands >> resolved by taking Tums. She is also on potassium 10 mEq daily for muscle cramps.  She had shortness of breath and during investigation she was found to have interstitial lung disease in 09/2018.  This is slightly better.  She was discharged from the pulmonology clinic with indications return as needed. Saw nephrology December 20, 2020 for decreased GFR >> normal evaluation, no further investigation needed. She stopped HCTZ due to decrease kidney fxn.  She started Olive leaf extract - for BP control.  She remains off HCTZ.  04/20/2022:  Cortisol nl HbA1c 4.8%  She also had a lot of stress around the time that her mother died in 12/21/18.  ROS: + See HPI  I reviewed pt's medications, allergies, PMH, social hx, family hx, and changes were documented in the history of present illness. Otherwise, unchanged from my initial visit note.  Past Medical History:  Diagnosis Date   Abnormal Pap smear of cervix    Hx of colposcopy but no treatment--paps normal since   Chronic back pain    Chronic vulvitis and desquamative vaginitis    bx benign hyperplasia   HSV-1 infection    Hypocalcemia    IBS (irritable bowel syndrome)    PVC (premature ventricular contraction) 2004   Thyroid disease    Hypothyroidism, Hypoparathyroidism   Yeast infection    Chronic   Past Surgical History:  Procedure Laterality Date   BASAL CELL  CARCINOMA EXCISION Left 05/2013   BREAST ENHANCEMENT SURGERY  07/2007   [place]   COLPOSCOPY     28 yrs ago   DIAGNOSTIC LAPAROSCOPY  1988   HNP Re-Repair  1/10  2009   thumb surgery Left 1989   THYROIDECTOMY  10/1995   due to thyroid nodule  L4-L5 laser Sx 2009 and 2010 L radial nerve repair 1989  Social History   Social History   Marital  status: Married    Spouse name: N/A   Number of children: 2   Occupational History   NP [employer]   Social History Main Topics   Smoking status: Never Smoker   Smokeless tobacco: Never Used   Alcohol use          Clark: 1-2 glasses of wine every mo   Drug use: No   Sexual activity: Yes    Partners: Male    Birth control/ protection: Post-menopausal   Social History Narrative   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     BORIC ACID EX Apply topically as needed.     calcitRIOL (ROCALTROL) 0.25 MCG capsule Take 1 capsule (0.25 mcg total) by mouth daily. 90 capsule 3   calcium carbonate (OSCAL) 1500 (600 Ca) MG TABS tablet Take 600 mg of elemental calcium by mouth 2 (two) times daily with a meal.     clindamycin (CLEOCIN) 2 % vaginal cream APPLY 1 APPLICATOR VAGINALLY ONCE WEEKLY 80 g 0   cyclobenzaprine (FLEXERIL) 5 MG tablet Take 5 mg by mouth as needed.     desonide (DESOWEN) 0.05 % ointment Apply topically twice weekly as needed for irritation 60 g 3   diazepam (VALIUM) 5 MG tablet Take 5 mg by mouth as needed for muscle spasms.     estradiol (ESTRACE) 0.1 MG/GM vaginal cream 1 gram vaginally twice weekly 42.5 g 3   estradiol (ESTRACE) 0.5 MG tablet Take 2 tablets (1 mg total) by mouth daily. 90 tablet 4   Estradiol (VAGIFEM) 10 MCG TABS vaginal tablet Place 1 tablet (10 mcg  total) vaginally 2 (two) times a week. 24 tablet 3   fexofenadine (ALLEGRA) 60 MG tablet Take 1 tablet by mouth daily.     fluconazole (DIFLUCAN) 150 MG tablet Take 1 tablet (150 mg total) by mouth as needed. May repeat second dosage in 72 hours if needed. 6 tablet 1   fluticasone (FLONASE) 50 MCG/ACT nasal spray 1 spray by Each Nare route daily.     hydrOXYzine (ATARAX/VISTARIL) 25 MG tablet Take by mouth as needed.     liothyronine (CYTOMEL) 5 MCG tablet Take 1 tablet (5 mcg total) by mouth daily. 45 tablet 5   Magnesium 500 MG CAPS Take by mouth.     Multiple Vitamins-Minerals (MULTIVITAMIN PO) Take by mouth.     Olive Leaf Extract 150 MG CAPS Take by mouth.     Omega-3 Fatty Acids (FISH OIL PO) Take by mouth.     potassium chloride (K-DUR) 10 MEQ tablet Take 10 mEq by mouth daily.  3   pregabalin (LYRICA) 50 MG capsule Take 50 mg by mouth at bedtime.      progesterone (PROMETRIUM) 100 MG capsule Take 1 capsule (100 mg total) by mouth daily. 90 capsule 4   pyridOXINE (VITAMIN B6) 100 MG tablet Take 100 mg by mouth daily.     SYNTHROID 88 MCG tablet Take 1 tablet (88 mcg total) by mouth daily before breakfast. 45 tablet 1   vitamin B-12 (CYANOCOBALAMIN) 1000 MCG tablet Take by mouth.     No current facility-administered medications on file prior to visit.   Allergies  Allergen Reactions   Famotidine  Affects calcium   Honey Bee Venom [Bee Venom] Other (See Comments)    Feels like has skipped heart beats   Macrobid [Nitrofurantoin] Nausea Only   Nsaids    Sulfa Antibiotics Nausea Only   Prednisone Rash   Family History  Problem Relation Age of Onset   Hypertension Mother    Stroke Mother    Thyroid disease Father        Hyper   Osteoporosis Father    Stroke Father        x2   Diabetes Brother     PE: BP 122/80   Pulse 95   Ht 5' 4.5" (1.638 m)   Wt 122 lb 9.6 oz (55.6 kg)   LMP 12/22/2014 (Exact Date)   SpO2 99%   BMI 20.72 kg/m   Wt Readings from Last 3  Encounters:  08/12/22 122 lb 9.6 oz (55.6 kg)  08/20/21 120 lb 12.8 oz (54.8 kg)  08/06/21 120 lb 3.2 oz (54.5 kg)   Constitutional: normal weight, in NAD Eyes: EOMI, no exophthalmos ENT: no neck masses, no cervical lymphadenopathy Cardiovascular: tachycardia, RR, No MRG Respiratory: CTA B Musculoskeletal: no deformities Skin: no rashes Neurological: no tremor with outstretched hands  ASSESSMENT: 1. Postsurgical hypothyroidism  2. Postsurgical hypoparathyroidism  PLAN:  1. Patient with longstanding, postsurgical, hypothyroidism, developed after her thyroidectomy in 1997 (benign pathology).  She is on LT4 + LT3. She had severe fatigue in the past when she tried to come off LT3. She has AP with generic LT4, so we are using Synthroid DAW. - latest thyroid labs reviewed with pt. >> normal 3 days ago - she prefers to have the labs checked before the visit, I [facility] : Lab Results  Component Value Date   TSH 4.200 08/09/2022  - she continues on Synthroid 88 mcg + Liothyronine 5 mcg daily - pt feels good on this dose except from persistent, significant, fatigue, which she feels is related to the thyroid - we discussed about taking the thyroid hormone every day, with water, >30 minutes before breakfast, separated by >4 hours from acid reflux medications, calcium, iron, multivitamins.  -we did discuss about desiccated thyroid extracts >> she would not want to start these - due to her fatigue, will increase the Synhtroid back to 100 mcg daily and continue the LT3. If the TSH is low at next check, will try to alternate Synthroid 100 with 88 every other day, rather than decrease the dose to 88 mcg daily. She agrees with the plan  2. Postsurgical hypoparathyroidism -well controlled on the current regimen - see HPI -she denies perioral numbness or hand cramping.  She has occasional mm cramps, which she attributes to low K - she is on a supplement.  She is also on a Mg supplement.  I also rec'd tonic  water before bedtime. -She was prev. On hydrochlorothiazide but stopped 2/2 low GFR.  Afterwards, she saw nephrology, but no further investigation or intervention was needed. -she continues on calcitriol 0.25 mcg daily -Mg, phos, calcitriol, vit D, and iCa were normal at last OV. A 24-hour urine calcium was normal repeatedly, with the latest in 2019. -we will repeat these at [facility] except for a iCa as her total Ca was normal 03/2022.  Orders Placed This Encounter  Procedures   Magnesium   Phosphorus   Vitamin D 1,25 dihydroxy   VITAMIN D 25 Hydroxy (Vit-D Deficiency, Fractures)   Carlus Pavlov, MD PhD [facility]

## 2022-08-12 NOTE — Patient Instructions (Addendum)
Please increase: Synthroid 100 mcg + liothyronine 5 mcg daily.  Take the thyroid hormone every day, with water, at least 30 minutes before breakfast, separated by at least 4 hours from: - acid reflux medications - calcium - iron - multivitamins  Please come back for a follow-up appointment in 1 year.

## 2022-08-15 ENCOUNTER — Other Ambulatory Visit (HOSPITAL_BASED_OUTPATIENT_CLINIC_OR_DEPARTMENT_OTHER): Payer: Self-pay | Admitting: Obstetrics & Gynecology

## 2022-08-18 LAB — T4 AND TSH
T4, Total: 6.6 ug/dL (ref 4.5–12.0)
TSH: 4.2 u[IU]/mL (ref 0.450–4.500)

## 2022-08-18 LAB — TSH+T3+FREE T4+T3 FREE
Free T-3: 3 pg/mL
TSH: 4.9 uU/mL — ABNORMAL HIGH
Triiodothyronine (T-3), Serum: 85 ng/dL

## 2022-08-22 ENCOUNTER — Other Ambulatory Visit: Payer: Self-pay | Admitting: Internal Medicine

## 2022-08-24 ENCOUNTER — Other Ambulatory Visit (HOSPITAL_BASED_OUTPATIENT_CLINIC_OR_DEPARTMENT_OTHER): Payer: Self-pay | Admitting: Obstetrics & Gynecology

## 2022-08-24 DIAGNOSIS — N760 Acute vaginitis: Secondary | ICD-10-CM

## 2022-09-16 ENCOUNTER — Ambulatory Visit (HOSPITAL_BASED_OUTPATIENT_CLINIC_OR_DEPARTMENT_OTHER): Payer: BC Managed Care – PPO | Admitting: Obstetrics & Gynecology

## 2022-10-06 ENCOUNTER — Other Ambulatory Visit: Payer: Self-pay | Admitting: Internal Medicine

## 2022-10-06 DIAGNOSIS — E890 Postprocedural hypothyroidism: Secondary | ICD-10-CM

## 2022-10-18 ENCOUNTER — Other Ambulatory Visit: Payer: Self-pay | Admitting: Internal Medicine

## 2022-10-18 DIAGNOSIS — E890 Postprocedural hypothyroidism: Secondary | ICD-10-CM

## 2022-10-25 ENCOUNTER — Encounter: Payer: Self-pay | Admitting: Internal Medicine

## 2022-11-02 ENCOUNTER — Other Ambulatory Visit (HOSPITAL_BASED_OUTPATIENT_CLINIC_OR_DEPARTMENT_OTHER): Payer: Self-pay | Admitting: Obstetrics & Gynecology

## 2022-11-02 DIAGNOSIS — B009 Herpesviral infection, unspecified: Secondary | ICD-10-CM

## 2022-11-24 ENCOUNTER — Encounter (HOSPITAL_BASED_OUTPATIENT_CLINIC_OR_DEPARTMENT_OTHER): Payer: Self-pay | Admitting: *Deleted

## 2022-12-27 ENCOUNTER — Ambulatory Visit (HOSPITAL_BASED_OUTPATIENT_CLINIC_OR_DEPARTMENT_OTHER): Payer: BC Managed Care – PPO | Admitting: Obstetrics & Gynecology

## 2022-12-29 ENCOUNTER — Ambulatory Visit (HOSPITAL_BASED_OUTPATIENT_CLINIC_OR_DEPARTMENT_OTHER): Payer: BC Managed Care – PPO | Admitting: Obstetrics & Gynecology

## 2023-01-04 ENCOUNTER — Other Ambulatory Visit (HOSPITAL_BASED_OUTPATIENT_CLINIC_OR_DEPARTMENT_OTHER): Payer: Self-pay | Admitting: Obstetrics & Gynecology

## 2023-01-04 DIAGNOSIS — N760 Acute vaginitis: Secondary | ICD-10-CM

## 2023-02-07 ENCOUNTER — Encounter (HOSPITAL_BASED_OUTPATIENT_CLINIC_OR_DEPARTMENT_OTHER): Payer: Self-pay | Admitting: Obstetrics & Gynecology

## 2023-02-07 ENCOUNTER — Ambulatory Visit (HOSPITAL_BASED_OUTPATIENT_CLINIC_OR_DEPARTMENT_OTHER): Payer: BC Managed Care – PPO | Admitting: Obstetrics & Gynecology

## 2023-02-07 VITALS — BP 137/85 | HR 83 | Ht 64.25 in | Wt 122.0 lb

## 2023-02-07 DIAGNOSIS — Z01419 Encounter for gynecological examination (general) (routine) without abnormal findings: Secondary | ICD-10-CM

## 2023-02-07 DIAGNOSIS — Z79890 Hormone replacement therapy (postmenopausal): Secondary | ICD-10-CM

## 2023-02-07 DIAGNOSIS — B009 Herpesviral infection, unspecified: Secondary | ICD-10-CM | POA: Diagnosis not present

## 2023-02-07 DIAGNOSIS — N94819 Vulvodynia, unspecified: Secondary | ICD-10-CM

## 2023-02-07 DIAGNOSIS — N951 Menopausal and female climacteric states: Secondary | ICD-10-CM | POA: Diagnosis not present

## 2023-02-07 DIAGNOSIS — B002 Herpesviral gingivostomatitis and pharyngotonsillitis: Secondary | ICD-10-CM

## 2023-02-07 DIAGNOSIS — N760 Acute vaginitis: Secondary | ICD-10-CM

## 2023-02-07 MED ORDER — FLUCONAZOLE 150 MG PO TABS: 150.0000 mg | ORAL_TABLET | ORAL | 1 refills | Status: AC | PRN

## 2023-02-07 MED ORDER — ESTRADIOL 0.1 MG/GM VA CREA: TOPICAL_CREAM | VAGINAL | 3 refills | Status: DC

## 2023-02-07 MED ORDER — ESTRADIOL 10 MCG VA TABS: 1.0000 | ORAL_TABLET | VAGINAL | 3 refills | Status: DC

## 2023-02-07 MED ORDER — CLINDAMYCIN PHOSPHATE 2 % VA CREA: TOPICAL_CREAM | VAGINAL | 6 refills | Status: DC

## 2023-02-07 MED ORDER — NORETHINDRONE ACETATE 5 MG PO TABS: 2.5000 mg | ORAL_TABLET | Freq: Every day | ORAL | 3 refills | Status: DC

## 2023-02-07 MED ORDER — ESTRADIOL 0.5 MG PO TABS: 0.5000 mg | ORAL_TABLET | Freq: Every day | ORAL | 4 refills | Status: DC

## 2023-02-07 MED ORDER — DESONIDE 0.05 % EX OINT: TOPICAL_OINTMENT | CUTANEOUS | 3 refills | Status: DC

## 2023-02-07 MED ORDER — TERCONAZOLE 80 MG VA SUPP: VAGINAL | 6 refills | Status: AC

## 2023-02-07 NOTE — Progress Notes (Signed)
 63 y.o. G5P2002 Married White or Caucasian female here for annual exam.  Doing well.  Denies vaginal bleeding.  Has chronic vaginitis.  Does need refills for the products she uses.   History of abnormal Pap:  remote hx MMG:  10/2022 Colonoscopy:  07/07/2022.  Follow up 5 years.   BMD:   09/06/2019, normal Screening Labs: 10/2022.  Done with Dr Thurmond   reports that she has never smoked. She has never used smokeless tobacco. She reports current alcohol use of about 2.0 standard drinks of alcohol per week. She reports that she does not use drugs.  Past Medical History:  Diagnosis Date   Abnormal Pap smear of cervix    Hx of colposcopy but no treatment--paps normal since   Chronic back pain    Chronic vulvitis and desquamative vaginitis    bx benign hyperplasia   HSV-1 infection    Hypocalcemia    IBS (irritable bowel syndrome)    PVC (premature ventricular contraction) 2004   Thyroid  disease    Hypothyroidism, Hypoparathyroidism   Yeast infection    Chronic    Past Surgical History:  Procedure Laterality Date   BASAL CELL CARCINOMA EXCISION Left 05/2013   BREAST ENHANCEMENT SURGERY  07/2007   Saline   COLPOSCOPY     28 yrs ago   DIAGNOSTIC LAPAROSCOPY  1988   HNP Re-Repair  1/10  2009   thumb surgery Left 1989   THYROIDECTOMY  10/1995   due to thyroid  nodule    Current Outpatient Medications  Medication Sig Dispense Refill   acyclovir  (ZOVIRAX ) 400 MG tablet TAKE 1 TABLET TWICE A DAY 180 tablet 0   aspirin EC 81 MG tablet Take 81 mg by mouth daily.     BORIC ACID EX Apply topically as needed.     calcitRIOL  (ROCALTROL ) 0.25 MCG capsule Take 1 capsule (0.25 mcg total) by mouth daily. 90 capsule 3   calcium carbonate (OSCAL) 1500 (600 Ca) MG TABS tablet Take 600 mg of elemental calcium by mouth 2 (two) times daily with a meal.     clindamycin  (CLEOCIN ) 2 % vaginal cream APPLY 1 APPLICATOR VAGINALLY ONCE WEEKLY 80 g 0   cyclobenzaprine (FLEXERIL) 5 MG tablet Take 5 mg by mouth  as needed.     desonide  (DESOWEN ) 0.05 % ointment Apply topically twice weekly as needed for irritation 60 g 3   diazepam (VALIUM) 5 MG tablet Take 5 mg by mouth as needed for muscle spasms.     estradiol  (ESTRACE ) 0.1 MG/GM vaginal cream INSERT 1 GM VAGINALLY TWICE WEEKLY 42.5 g 0   estradiol  (ESTRACE ) 0.5 MG tablet Take 2 tablets (1 mg total) by mouth daily. 90 tablet 4   Estradiol  10 MCG TABS vaginal tablet INSERT 1 TABLET VAGINALLY TWICE A WEEK 24 tablet 1   fexofenadine (ALLEGRA) 60 MG tablet Take 1 tablet by mouth daily.     fluconazole  (DIFLUCAN ) 150 MG tablet Take 1 tablet (150 mg total) by mouth as needed. May repeat second dosage in 72 hours if needed. 6 tablet 1   fluticasone (FLONASE) 50 MCG/ACT nasal spray 1 spray by Each Nare route daily.     hydrOXYzine (ATARAX/VISTARIL) 25 MG tablet Take by mouth as needed.     Lactobacillus (PROBIOTIC ACIDOPHILUS PO) Take by mouth.     liothyronine  (CYTOMEL ) 5 MCG tablet Take 1 tablet (5 mcg total) by mouth daily. 90 tablet 3   Magnesium 500 MG CAPS Take by mouth.     Multiple  Vitamins-Minerals (MULTIVITAMIN PO) Take by mouth.     Olive Leaf Extract 150 MG CAPS Take by mouth.     Omega-3 Fatty Acids (FISH OIL PO) Take by mouth.     potassium chloride (K-DUR) 10 MEQ tablet Take 10 mEq by mouth daily.  3   Prasterone, DHEA, (DHEA 50) 50 MG CAPS Take by mouth.     pregabalin (LYRICA) 50 MG capsule Take 50 mg by mouth at bedtime.      progesterone  (PROMETRIUM ) 100 MG capsule Take 1 capsule (100 mg total) by mouth daily. 90 capsule 4   pyridOXINE (VITAMIN B6) 100 MG tablet Take 100 mg by mouth daily.     SYNTHROID  100 MCG tablet Take 1 tablet (100 mcg total) by mouth daily before breakfast. 30 tablet 5   vitamin B-12 (CYANOCOBALAMIN ) 1000 MCG tablet Take by mouth.     No current facility-administered medications for this visit.    Family History  Problem Relation Age of Onset   Hypertension Mother    Stroke Mother    Thyroid  disease Father         Hyper   Osteoporosis Father    Stroke Father        x2   Diabetes Brother     ROS: Constitutional: negative Genitourinary:negative  Exam:   BP 137/85 (BP Location: Right Arm, Patient Position: Sitting, Cuff Size: Normal)   Pulse 83   Ht 5' 4.25 (1.632 m)   Wt 122 lb (55.3 kg)   LMP 12/22/2014 (Exact Date)   BMI 20.78 kg/m   Height: 5' 4.25 (163.2 cm)  General appearance: alert, cooperative and appears stated age Head: Normocephalic, without obvious abnormality, atraumatic Neck: no adenopathy, supple, symmetrical, trachea midline and thyroid  normal to inspection and palpation Lungs: clear to auscultation bilaterally Breasts: normal appearance, no masses or tenderness Heart: regular rate and rhythm Abdomen: soft, non-tender; bowel sounds normal; no masses,  no organomegaly Extremities: extremities normal, atraumatic, no cyanosis or edema Skin: Skin color, texture, turgor normal. No rashes or lesions Lymph nodes: Cervical, supraclavicular, and axillary nodes normal. No abnormal inguinal nodes palpated Neurologic: Grossly normal   Pelvic: External genitalia:  no lesions              Urethra:  normal appearing urethra with no masses, tenderness or lesions              Bartholins and Skenes: normal                 Vagina: normal appearing vagina with normal color and no discharge, no lesions              Cervix: no lesions              Pap taken: No. Bimanual Exam:  Uterus:  normal size, contour, position, consistency, mobility, non-tender              Adnexa: normal adnexa and no mass, fullness, tenderness               Rectovaginal: Confirms               Anus:  normal sphincter tone, no lesions  Chaperone, Bascom Kotyk, CMA, was present for exam.  Assessment/Plan: 1. Well woman exam with routine gynecological exam (Primary) - Pap smear 07/2020.  Will repeat next year per pt request. - Mammogram 10/2022 - Colonoscopy 07/07/2022.  Follow up 5 years. - Bone  mineral density 09/06/2019.  Normal - lab work done with  PCP. - vaccines reviewed/updated  2. Menopausal syndrome on hormone replacement therapy - estradiol  (ESTRACE ) 0.5 MG tablet; Take 1 tablet (0.5 mg total) by mouth daily.  Dispense: 90 tablet; Refill: 4 - norethindrone  (AYGESTIN ) 5 MG tablet; Take 0.5 tablets (2.5 mg total) by mouth daily.  Dispense: 90 tablet; Refill: 3  3. Recurrent vaginitis - Estradiol  10 MCG TABS vaginal tablet; Place 1 tablet (10 mcg total) vaginally 2 (two) times a week.  Dispense: 24 tablet; Refill: 3 - estradiol  (ESTRACE ) 0.1 MG/GM vaginal cream; INSERT 1 GM VAGINALLY TWICE WEEKLY  Dispense: 42.5 g; Refill: 3 - clindamycin  (CLEOCIN ) 2 % vaginal cream; Apply 1 applicator vaginally once weekly.  Dispense: 80 g; Refill: 6 - desonide  (DESOWEN ) 0.05 % ointment; Apply topically twice weekly as needed for irritation  Dispense: 60 g; Refill: 3 - terconazole  (TERAZOL 3 ) 80 MG vaginal suppository; Place one suppository vaginally three times weekly.  Dispense: 3 suppository; Refill: 6 - fluconazole  (DIFLUCAN ) 150 MG tablet; Take 1 tablet (150 mg total) by mouth as needed. May repeat second dosage in 72 hours if needed.  Dispense: 6 tablet; Refill: 1  4. Vulvodynia  5. Oral herpes

## 2023-02-22 ENCOUNTER — Other Ambulatory Visit: Payer: Self-pay

## 2023-02-22 DIAGNOSIS — E890 Postprocedural hypothyroidism: Secondary | ICD-10-CM

## 2023-02-22 DIAGNOSIS — E892 Postprocedural hypoparathyroidism: Secondary | ICD-10-CM

## 2023-02-22 MED ORDER — SYNTHROID 100 MCG PO TABS: 100.0000 ug | ORAL_TABLET | Freq: Every day | ORAL | 5 refills | Status: DC

## 2023-05-18 ENCOUNTER — Telehealth (HOSPITAL_BASED_OUTPATIENT_CLINIC_OR_DEPARTMENT_OTHER): Payer: Self-pay | Admitting: *Deleted

## 2023-05-18 NOTE — Telephone Encounter (Signed)
 Called pt to let her know that the pharmacy sent us  notification of potential prescription interactions with her fluconazole  and hydroxyzine. Advised pt that she should wait 3 days after taking fluconazole  before taking hydroxyzine due to potential interactions. Pt verbalized understanding.

## 2023-08-11 ENCOUNTER — Ambulatory Visit: Payer: BC Managed Care – PPO | Admitting: Internal Medicine

## 2023-08-11 ENCOUNTER — Encounter: Payer: Self-pay | Admitting: Internal Medicine

## 2023-08-11 VITALS — BP 120/70 | HR 83 | Ht 64.25 in | Wt 123.4 lb

## 2023-08-11 DIAGNOSIS — E892 Postprocedural hypoparathyroidism: Secondary | ICD-10-CM

## 2023-08-11 DIAGNOSIS — E890 Postprocedural hypothyroidism: Secondary | ICD-10-CM

## 2023-08-11 NOTE — Patient Instructions (Signed)
 Please increase: Synthroid  100 mcg + liothyronine  5 mcg daily.  Take the thyroid  hormone every day, with water, at least 30 minutes before breakfast, separated by at least 4 hours from: - acid reflux medications - calcium - iron - multivitamins  Please stop at the lab.  Please come back for a follow-up appointment in 1 year.

## 2023-08-11 NOTE — Progress Notes (Addendum)
 Patient ID: Tabitha Clark, female   DOB: 1960/03/03, 63 y.o.   MRN: [id number]   HPI  Tabitha Clark is a 63 y.o.-year-old very pleasant female, initially referred by Avelina Bolster, FNP, returning for follow-up for postsurgical hypothyroidism and hypoparathyroidism, dx'ed in 1997 after thyroidectomy for a thyroid  nodule (turned out to be benign).  Last visit 1 year ago.  Interim history: She continues to have fatigue.  She also has occasional muscle cramps - on Mg, K, occasional Tums at night  No tingling or hand cramping. She had extensive oral surgery with gum grafts in 03/2023 >> her face swelled up significantly and then she developed facial bruising. She could not take steroids - as she is allergic.  The swelling resolved afterwards.  Postsurgical hypothyroidism  She is on Synthroid  d.a.w. 100 mcg + liothyronine  generic 5 mcg daily.   She tried generic levothyroxine developed abdominal pain.   Also, she had PVCs when she tried to decrease her levothyroxine dose (!). She also needs to have liothyronine , otherwise, she has extreme fatigue.  Pt takes the thyroid  hormones: - in am - fasting - at least 30 min-1h  from b'fast - + Ca, MVI >3 hours after levothyroxine (she does this every day) - no Fe, PPIs - not on Biotin  Reviewed patient's TFTs: 10/27/2022: TSH 1.146 Lab Results  Component Value Date   TSH 4.200 08/09/2022   TSH 0.26 (L) 08/06/2021   TSH 0.74 08/07/2020   TSH 0.61 08/02/2019   TSH 1.00 07/20/2018   TSH 1.080 01/12/2018   TSH 1.57 07/21/2017   TSH 0.90 01/13/2017   FREET4 1.12 08/06/2021   FREET4 1.11 08/07/2020   FREET4 1.29 08/02/2019   FREET4 0.97 07/20/2018   FREET4 1.58 01/12/2018   FREET4 0.97 07/21/2017   FREET4 1.11 01/13/2017  04/20/2022: TSH 4.4 10/13/2021: TSH 1.65 06/24/2016: TSH 0.853 04/11/2016: TSH 0.407 06/29/2015: TSH 0.03, free t4: 1.06, free t3 2.58 (LT4 dose decreased to 100 mcg daily) TPO Abs (03/07/2014): 37 (<34).  She  stopped OCPs in 2016 and had diet change (eliminated gluten and dairy) >> lost weight.  Pt denies: - feeling nodules in neck - hoarseness - dysphagia - choking  She has + FH of thyroid  disorders in: father >> Htyr. No FH of thyroid  cancer. No h/o radiation tx to head or neck. No herbal supplements. No Biotin use. No recent steroids use (allergic to steroids) except Flonase.   Postsurgical hypoparathyroidism.  She continues on: - vitamin D  + Ca (Caltrate): 400 units + 600 mg - on Ca 2x a day - Multivitamin: 200 mg calcium + ? Vitamin D  - calcitriol  0.25 mcg daily with breakfast - Magnesium 500 mg daily Off HCTZ 25  mg in a.m. >> she was tolerating this well, without dehydration, but with occasional dizziness (possibly from Mnire's disease, reportedly).  However, she stopped this in 2022.  She has slightly low calcium levels, which are actually at goal for postsurgical hypocalcemia.  At last visit, ionized calcium was normal:  Lab Results  Component Value Date   CAION 4.7 08/06/2021   CAION 4.85 08/07/2020   CAION 4.5 (L) 08/02/2019   CAION 4.65 (L) 07/20/2018   CAION 5.1 01/12/2018   Component     Latest Ref Rng 08/06/2021  Calcium Ionized     4.7 - 5.5 mg/dL 4.7   Vitamin D  1, 25 (OH) Total     18 - 72 pg/mL 43   Vitamin D3 1, 25 (OH)  pg/mL 43   Vitamin D2 1, 25 (OH)     pg/mL <8    06/01/2023: Calcium 8.8 (8.6-10.3) 10/28/2022: Calcium 9.0 04/20/2022: calcium 8.9 Lab Results  Component Value Date   CALCIUM 9.2 08/07/2020  04/01/2016: Ca 9.0 06/29/2015: Ca 10.5, PTH <1  Magnesium and phosphorus normal: 10/27/2022: Phosphorus 4 (2.5-5) Lab Results  Component Value Date   MG 2.2 08/06/2021   MG 1.8 01/13/2017   PHOS 3.4 08/06/2021   PHOS 4.1 08/07/2020   PHOS 3.6 01/13/2017   Vitamin D  levels normal: Lab Results  Component Value Date   VD25OH 66.39 08/06/2021   VD25OH 72.0 08/02/2019   VD25OH 52.44 07/21/2017   VD25OH 55.53 01/13/2017    Her 24-hour  urine calcium levels were normal: Component     Latest Ref Rng & Units 02/10/2017  Calcium, 24H Urine     mg/24 h 235  Creatinine, 24H Ur     0.50 - 2.15 g/24 h 1.34  08/14/2013: 137 06/11/2012: 132  She saw nephrology previously. Latest kidney fxn available: 06/01/2023: 21/1.13, GFR 55 Lab Results  Component Value Date   BUN 16 08/07/2020   BUN 17 07/21/2017   Lab Results  Component Value Date   CREATININE 0.98 08/07/2020   She may get hypocalcemic symptoms if she gets dehydrated.  She does mention she had tetany before. She previously had occasional tingling hands >> resolved by taking Tums.  She did not have these recently. She is also on potassium 10 mEq daily for muscle cramps.  She had shortness of breath and during investigation she was found to have interstitial lung disease in 09/2018.  This is slightly better.  She was discharged from the pulmonology clinic with indications return as needed. Saw nephrology 01/15/21 for decreased GFR >> normal evaluation, no further investigation needed. She stopped HCTZ due to decrease kidney fxn.    04/20/2022:  Cortisol nl HbA1c 4.8%  She also had a lot of stress around the time that her mother died in 2019/01/16.  ROS: + See HPI  I reviewed pt's medications, allergies, PMH, social hx, family hx, and changes were documented in the history of present illness. Otherwise, unchanged from my initial visit note.  Past Medical History:  Diagnosis Date   Abnormal Pap smear of cervix    Hx of colposcopy but no treatment--paps normal since   Chronic back pain    Chronic vulvitis and desquamative vaginitis    bx benign hyperplasia   HSV-1 infection    Hypocalcemia    IBS (irritable bowel syndrome)    PVC (premature ventricular contraction) 2004   Thyroid  disease    Hypothyroidism, Hypoparathyroidism   Yeast infection    Chronic   Past Surgical History:  Procedure Laterality Date   BASAL CELL CARCINOMA EXCISION Left 05/2013   BREAST  ENHANCEMENT SURGERY  07/2007   Saline   COLPOSCOPY     28 yrs ago   DIAGNOSTIC LAPAROSCOPY  1988   HNP Re-Repair  1/10  2009   thumb surgery Left 1989   THYROIDECTOMY  10/1995   due to thyroid  nodule  L4-L5 laser Sx 2009 and 2010 L radial nerve repair 1989  Social History   Social History   Marital status: Married    Spouse name: N/A   Number of children: 2   Occupational History   NP [employer]   Social History Main Topics   Smoking status: Never Smoker   Smokeless tobacco: Never Used   Alcohol use  Comment: 1-2 glasses of wine every mo   Drug use: No   Sexual activity: Yes    Partners: Male    Birth control/ protection: Post-menopausal   Social History Narrative   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     BORIC ACID EX Apply topically as needed.     calcitRIOL  (ROCALTROL ) 0.25 MCG capsule Take 1 capsule (0.25 mcg total) by mouth daily. 90 capsule 3   calcium carbonate (OSCAL) 1500 (600 Ca) MG TABS tablet Take 600 mg of elemental calcium by mouth 2 (two) times daily with a meal.     clindamycin  (CLEOCIN ) 2 % vaginal cream Apply 1 applicator vaginally once weekly. 80 g 6   cyclobenzaprine (FLEXERIL) 5 MG tablet Take 5 mg by mouth as needed.     desonide  (DESOWEN ) 0.05 % ointment Apply topically twice weekly as needed for irritation 60 g 3   diazepam (VALIUM) 5 MG tablet Take 5 mg by mouth as needed for muscle spasms.     estradiol  (ESTRACE ) 0.1 MG/GM vaginal cream INSERT 1 GM VAGINALLY TWICE WEEKLY 42.5 g 3   estradiol  (ESTRACE ) 0.5 MG tablet Take 1 tablet (0.5 mg total) by mouth daily. 90 tablet 4   Estradiol  10 MCG TABS vaginal tablet Place 1 tablet (10 mcg total) vaginally 2 (two) times a week. 24 tablet 3   fexofenadine (ALLEGRA) 60 MG tablet Take 1 tablet by mouth daily.      fluconazole  (DIFLUCAN ) 150 MG tablet Take 1 tablet (150 mg total) by mouth as needed. May repeat second dosage in 72 hours if needed. 6 tablet 1   fluticasone (FLONASE) 50 MCG/ACT nasal spray 1 spray by Each Nare route daily.     hydrOXYzine (ATARAX/VISTARIL) 25 MG tablet Take by mouth as needed.     Lactobacillus (PROBIOTIC ACIDOPHILUS PO) Take by mouth.     liothyronine  (CYTOMEL ) 5 MCG tablet Take 1 tablet (5 mcg total) by mouth daily. 90 tablet 3   Magnesium 500 MG CAPS Take by mouth.     Multiple Vitamins-Minerals (MULTIVITAMIN PO) Take by mouth.     norethindrone  (AYGESTIN ) 5 MG tablet Take 0.5 tablets (2.5 mg total) by mouth daily. 90 tablet 3   Olive Leaf Extract 150 MG CAPS Take by mouth.     Omega-3 Fatty Acids (FISH OIL PO) Take by mouth.     potassium chloride (K-DUR) 10 MEQ tablet Take 10 mEq by mouth daily.  3   Prasterone, DHEA, (DHEA 50) 50 MG CAPS Take by mouth.     pregabalin (LYRICA) 50 MG capsule Take 50 mg by mouth at bedtime.      pyridOXINE (VITAMIN B6) 100 MG tablet Take 100 mg by mouth daily.     SYNTHROID  100 MCG tablet Take 1 tablet (100 mcg total) by mouth daily before breakfast. 30 tablet 5   terconazole  (TERAZOL 3 ) 80 MG vaginal suppository Place one suppository vaginally three times weekly. 3 suppository 6   vitamin B-12 (CYANOCOBALAMIN ) 1000 MCG tablet Take by mouth.     No current facility-administered medications on file prior to visit.   Allergies  Allergen Reactions   Famotidine     Affects calcium   Honey Bee Venom [Bee Venom] Other (See Comments)    Feels like has skipped heart beats  Macrobid [Nitrofurantoin] Nausea Only   Nsaids    Sulfa Antibiotics Nausea Only   Prednisone Rash   Family History  Problem Relation Age of Onset   Hypertension Mother    Stroke Mother    Thyroid  disease Father        Hyper   Osteoporosis Father    Stroke Father        x2   Diabetes Brother    PE: BP 120/70   Pulse 83   Ht 5' 4.25 (1.632 m)   Wt  123 lb 6.4 oz (56 kg)   LMP 12/22/2014 (Exact Date)   SpO2 99%   BMI 21.02 kg/m   Wt Readings from Last 3 Encounters:  08/11/23 123 lb 6.4 oz (56 kg)  02/07/23 122 lb (55.3 kg)  08/12/22 122 lb 9.6 oz (55.6 kg)   Constitutional: normal weight, in NAD Eyes: EOMI, no exophthalmos ENT: no neck masses, no cervical lymphadenopathy Cardiovascular: tachycardia, RR, No MRG Respiratory: CTA B Musculoskeletal: no deformities Skin: no rashes Neurological: no tremor with outstretched hands  ASSESSMENT: 1. Postsurgical hypothyroidism  2. Postsurgical hypoparathyroidism  PLAN:  1. Patient with longstanding, postsurgical, hypothyroidism, developed after her total thyroidectomy in 1997 (benign pathology).  She continues on Synthroid  d.a.w. (she had abdominal pain with generic LT4), and also generic liothyronine .  She tried to come off LT3 in the past but she had severe fatigue and she had to restart. - latest thyroid  labs reviewed with pt. >> normal, improved, in 10/2022 - she continues on Synthroid  100 mcg + liothyronine  5 mcg daily - pt feels good on this dose except for chronic significant fatigue.  She does feel that this is related to the thyroid .  At last visit, we tried to increase the Synthroid  from 88 to 100 micrograms daily to see if her fatigue improved. - we previously discussed about desiccated thyroid  extract, but she did not want to start this.  - we discussed about taking the thyroid  hormone every day, with water, >30 minutes before breakfast, separated by >4 hours from acid reflux medications, calcium, iron, multivitamins. Pt. is taking it correctly. - will check thyroid  tests today: TSH and fT4 - If labs are abnormal, she will need to return for repeat TFTs in 1.5 months  2. Postsurgical hypoparathyroidism - Controlled on the current regimen -No perioral numbness or hand cramping.  She has occasional muscle cramps, which she attributes to low potassium and she takes a supplement  for this.  She also takes a magnesium supplement.  I previously recommended tonic water before bedtime.  She continues to have muscle cramps. -She was previously on hydrochlorothiazide  but stopped due to low GFR.  Afterwards, she saw nephrology but no further investigation or intervention was suggested. - Continues on calcitriol  0.25 mcg daily - Magnesium, phosphorus, calcitriol , vitamin D , and ionized calcium were normal.  We also reviewed the 24-hour urine calcium level, which was normal repeatedly, with the latest in 2019.  Latest calcium level was reviewed from 06/01/2023 and this was normal. - At today's visit, we will recheck her vitamin D  level.  We discussed about the possibility to stop her calcitriol  level if possible, but she would be very nervous to stop this especially as she does mention she has a history of tetany.  Orders Placed This Encounter  Procedures   TSH   T4, free   VITAMIN D  25 Hydroxy (Vit-D Deficiency, Fractures)   T3, free   She needs refills. If we need to decrease  the LT4 dose >> she would prefer to alternate 88 with 100 every other day  Component     Latest Ref Rng 08/11/2023  Triiodothyronine,Free,Serum     2.3 - 4.2 pg/mL 3.5   TSH     0.40 - 4.50 mIU/L 0.56   T4,Free(Direct)     0.8 - 1.8 ng/dL 1.5   Vitamin D , 25-Hydroxy     30 - 100 ng/mL 77   Labs are normal.  For now, we can continue the same dose of thyroid  hormones and vitamin D  supplement.  Lela Fendt, MD PhD [facility]

## 2023-08-12 LAB — T4, FREE: Free T4: 1.5 ng/dL (ref 0.8–1.8)

## 2023-08-12 LAB — TSH: TSH: 0.56 m[IU]/L (ref 0.40–4.50)

## 2023-08-12 LAB — T3, FREE: T3, Free: 3.5 pg/mL (ref 2.3–4.2)

## 2023-08-12 LAB — VITAMIN D 25 HYDROXY (VIT D DEFICIENCY, FRACTURES): Vit D, 25-Hydroxy: 77 ng/mL (ref 30–100)

## 2023-08-14 ENCOUNTER — Ambulatory Visit: Payer: Self-pay | Admitting: Internal Medicine

## 2023-08-14 MED ORDER — SYNTHROID 100 MCG PO TABS: 100.0000 ug | ORAL_TABLET | Freq: Every day | ORAL | 3 refills | Status: DC

## 2023-08-14 MED ORDER — LIOTHYRONINE SODIUM 5 MCG PO TABS: 5.0000 ug | ORAL_TABLET | Freq: Every day | ORAL | 3 refills | Status: DC

## 2023-08-14 NOTE — Addendum Note (Signed)
 Addended by: TRIXIE FILE on: 08/14/2023 03:19 PM   Modules accepted: Orders

## 2023-08-29 ENCOUNTER — Other Ambulatory Visit: Payer: Self-pay | Admitting: Internal Medicine

## 2023-08-29 DIAGNOSIS — E890 Postprocedural hypothyroidism: Secondary | ICD-10-CM

## 2023-08-29 DIAGNOSIS — E892 Postprocedural hypoparathyroidism: Secondary | ICD-10-CM

## 2023-09-23 ENCOUNTER — Other Ambulatory Visit: Payer: Self-pay | Admitting: Internal Medicine

## 2023-09-23 DIAGNOSIS — E890 Postprocedural hypothyroidism: Secondary | ICD-10-CM

## 2023-11-17 ENCOUNTER — Encounter (HOSPITAL_BASED_OUTPATIENT_CLINIC_OR_DEPARTMENT_OTHER): Payer: Self-pay | Admitting: Obstetrics & Gynecology

## 2024-02-15 ENCOUNTER — Ambulatory Visit (HOSPITAL_BASED_OUTPATIENT_CLINIC_OR_DEPARTMENT_OTHER): Payer: BC Managed Care – PPO | Admitting: Obstetrics & Gynecology

## 2024-02-15 ENCOUNTER — Other Ambulatory Visit (HOSPITAL_COMMUNITY)
Admission: RE | Admit: 2024-02-15 | Discharge: 2024-02-15 | Disposition: A | Source: Ambulatory Visit | Attending: Obstetrics & Gynecology | Admitting: Obstetrics & Gynecology

## 2024-02-15 ENCOUNTER — Encounter (HOSPITAL_BASED_OUTPATIENT_CLINIC_OR_DEPARTMENT_OTHER): Payer: Self-pay | Admitting: Obstetrics & Gynecology

## 2024-02-15 VITALS — BP 127/85 | HR 84 | Ht 64.5 in | Wt 128.6 lb

## 2024-02-15 DIAGNOSIS — Z79890 Hormone replacement therapy (postmenopausal): Secondary | ICD-10-CM

## 2024-02-15 DIAGNOSIS — N760 Acute vaginitis: Secondary | ICD-10-CM

## 2024-02-15 DIAGNOSIS — E2839 Other primary ovarian failure: Secondary | ICD-10-CM

## 2024-02-15 DIAGNOSIS — Z124 Encounter for screening for malignant neoplasm of cervix: Secondary | ICD-10-CM | POA: Insufficient documentation

## 2024-02-15 DIAGNOSIS — Z01419 Encounter for gynecological examination (general) (routine) without abnormal findings: Secondary | ICD-10-CM

## 2024-02-15 DIAGNOSIS — Z1151 Encounter for screening for human papillomavirus (HPV): Secondary | ICD-10-CM | POA: Diagnosis not present

## 2024-02-15 DIAGNOSIS — Z1331 Encounter for screening for depression: Secondary | ICD-10-CM | POA: Diagnosis not present

## 2024-02-15 DIAGNOSIS — N951 Menopausal and female climacteric states: Secondary | ICD-10-CM | POA: Diagnosis not present

## 2024-02-15 MED ORDER — CLINDAMYCIN PHOSPHATE 2 % VA CREA: TOPICAL_CREAM | VAGINAL | 6 refills | Status: AC

## 2024-02-15 MED ORDER — DESONIDE 0.05 % EX OINT: TOPICAL_OINTMENT | CUTANEOUS | 1 refills | Status: AC

## 2024-02-15 MED ORDER — ESTRADIOL 0.01 % VA CREA: TOPICAL_CREAM | VAGINAL | 6 refills | Status: AC

## 2024-02-15 MED ORDER — ESTRADIOL 10 MCG VA TABS: 1.0000 | ORAL_TABLET | VAGINAL | 3 refills | Status: AC

## 2024-02-15 MED ORDER — MEDROXYPROGESTERONE ACETATE 5 MG PO TABS: 5.0000 mg | ORAL_TABLET | Freq: Every day | ORAL | 4 refills | Status: AC

## 2024-02-15 MED ORDER — ESTRADIOL 0.5 MG PO TABS: 0.5000 mg | ORAL_TABLET | Freq: Every day | ORAL | 4 refills | Status: AC

## 2024-02-15 NOTE — Progress Notes (Signed)
 "  ANNUAL EXAM Patient name: Tabitha Clark MRN [id number]  Date of birth: 08-02-1960 Chief Complaint:   Gynecologic Exam  History of Present Illness:   Tabitha Clark is a 64 y.o. G73P2002 Caucasian female being seen today for a routine annual exam.  Patient reports that she had oral surgery in March 2025. This was an extensive surgery with bone and gum grafting.  She was switched to norethindrone  last year when I saw her.  She felt she had hormonal change with this including oily skin, acne, bloating.  Had some spotting as well.  So she switched back to provera .  Symptoms have all resolved.    Had some issues with renal function this past year.  She added more water and creatinine and GFR are back to normal.     Patient's last menstrual period was 12/22/2014.  Last pap 07/28/2020. Results were: NILM w/ HRHPV negative. H/O abnormal pap: yes Last mammogram: 11/17/2023. Results were: normal. Family h/o breast cancer: no Last colonoscopy: 07/07/2022. Follow up 5 years. Family h/o colorectal cancer: no     02/15/2024    1:56 PM 02/07/2023    2:40 PM 08/20/2021   10:51 AM 08/20/2021   10:45 AM 07/28/2020    2:00 PM  Depression screen PHQ 2/9  Decreased Interest 0 0 0 0 0  Down, Depressed, Hopeless 0 0 0 0 0  PHQ - 2 Score 0 0 0 0 0    Review of Systems:   Pertinent items are noted in HPI Denies any urinary changes.  Having a little constipation.   Pertinent History Reviewed:  Reviewed past medical,surgical, social and family history.  Reviewed problem list, medications and allergies. Physical Assessment:   Vitals:   02/15/24 1351  BP: 127/85  Pulse: 84  SpO2: 100%  Weight: 128 lb 9.6 oz (58.3 kg)  Height: 5' 4.5 (1.638 m)  Body mass index is 21.73 kg/m.        Physical Examination:   General appearance - well appearing, and in no distress  Mental status - alert, oriented to person, place, and time  Psych:  She has a normal mood and affect  Skin - warm and dry, normal  color, no suspicious lesions noted  Chest - effort normal, all lung fields clear to auscultation bilaterally  Heart - normal rate and regular rhythm  Neck:  midline trachea, no thyromegaly or nodules  Breasts - breasts appear normal, no suspicious masses, no skin or nipple changes or  axillary nodes  Abdomen - soft, nontender, nondistended, no masses or organomegaly  Pelvic - VULVA: normal appearing vulva with no masses, tenderness or lesions   VAGINA: normal appearing vagina with normal color and discharge, no lesions   CERVIX: normal appearing cervix without discharge or lesions, no CMT  Thin prep pap is with HR HPV cotesting  UTERUS: uterus is felt to be normal size, shape, consistency and nontender   ADNEXA: No adnexal masses or tenderness noted.  Rectal - normal rectal, good sphincter tone, no masses felt  Extremities:  No swelling or varicosities noted  Chaperone present for exam  No results found for this or any previous visit (from the past 24 hours).  Assessment & Plan:  1. Well woman exam with routine gynecological exam (Primary) - Pap smear 7/5/202.  Updated today. - Mammogram 11/17/2023 - Colonoscopy 07/07/2022.  Follow up 5 years. - Bone mineral density 2021.  Ordered to update this year. - lab work done with PCP, Dr. Thurmond -  vaccines reviewed/updated  2. Menopausal syndrome on hormone replacement therapy - estradiol  (ESTRACE ) 0.5 MG tablet; Take 1 tablet (0.5 mg total) by mouth daily.  Dispense: 90 tablet; Refill: 4 - medroxyPROGESTERone  (PROVERA ) 5 MG tablet; Take 1 tablet (5 mg total) by mouth daily.  Dispense: 90 tablet; Refill: 4  3. Recurrent vaginitis - clindamycin  (CLEOCIN ) 2 % vaginal cream; Apply 1 applicator vaginally once weekly.  Dispense: 80 g; Refill: 6 - Estradiol  10 MCG TABS vaginal tablet; Place 1 tablet (10 mcg total) vaginally 2 (two) times a week.  Dispense: 24 tablet; Refill: 3 - desonide  (DESOWEN ) 0.05 % ointment; Apply topically twice weekly as  needed for irritation  Dispense: 60 g; Refill: 1  4. Hypoestrogenism - DG Bone Density; Future  5. Cervical cancer screening - Cytology - PAP( [place])   Orders Placed This Encounter  Procedures   DG Bone Density    Meds:  Meds ordered this encounter  Medications   clindamycin  (CLEOCIN ) 2 % vaginal cream    Sig: Apply 1 applicator vaginally once weekly.    Dispense:  80 g    Refill:  6   Estradiol  10 MCG TABS vaginal tablet    Sig: Place 1 tablet (10 mcg total) vaginally 2 (two) times a week.    Dispense:  24 tablet    Refill:  3   estradiol  (ESTRACE ) 0.01 % CREA vaginal cream    Sig: 1 gram vaginally twice weekly    Dispense:  42.5 g    Refill:  6   estradiol  (ESTRACE ) 0.5 MG tablet    Sig: Take 1 tablet (0.5 mg total) by mouth daily.    Dispense:  90 tablet    Refill:  4   medroxyPROGESTERone  (PROVERA ) 5 MG tablet    Sig: Take 1 tablet (5 mg total) by mouth daily.    Dispense:  90 tablet    Refill:  4   desonide  (DESOWEN ) 0.05 % ointment    Sig: Apply topically twice weekly as needed for irritation    Dispense:  60 g    Refill:  1    Follow-up: Return in about 1 year (around 02/14/2025).  Ronal GORMAN [place], MD 02/18/2024 4:26 AM "

## 2024-02-16 LAB — CYTOLOGY - PAP
Comment: NEGATIVE
Diagnosis: NEGATIVE
High risk HPV: NEGATIVE

## 2024-02-17 ENCOUNTER — Ambulatory Visit (HOSPITAL_BASED_OUTPATIENT_CLINIC_OR_DEPARTMENT_OTHER): Payer: Self-pay | Admitting: Obstetrics & Gynecology

## 2024-02-27 ENCOUNTER — Other Ambulatory Visit: Payer: Self-pay | Admitting: Internal Medicine

## 2024-02-27 DIAGNOSIS — E892 Postprocedural hypoparathyroidism: Secondary | ICD-10-CM

## 2024-02-27 DIAGNOSIS — E890 Postprocedural hypothyroidism: Secondary | ICD-10-CM

## 2024-08-16 ENCOUNTER — Ambulatory Visit: Admitting: Internal Medicine
